# Patient Record
Sex: Female | Born: 1985 | Race: Black or African American | Hispanic: No | Marital: Single | Smoking: Never smoker
Health system: Southern US, Community
[De-identification: ages and names within clinical notes are randomized; demographics above are authoritative.]

## PROBLEM LIST (undated history)

## (undated) DIAGNOSIS — I2699 Other pulmonary embolism without acute cor pulmonale: Secondary | ICD-10-CM

## (undated) DIAGNOSIS — O2230 Deep phlebothrombosis in pregnancy, unspecified trimester: Secondary | ICD-10-CM

## (undated) DIAGNOSIS — I82409 Acute embolism and thrombosis of unspecified deep veins of unspecified lower extremity: Secondary | ICD-10-CM

## (undated) HISTORY — DX: Acute embolism and thrombosis of unspecified deep veins of unspecified lower extremity: I82.409

---

## 2009-09-26 DIAGNOSIS — I2699 Other pulmonary embolism without acute cor pulmonale: Secondary | ICD-10-CM | POA: Insufficient documentation

## 2009-10-03 ENCOUNTER — Inpatient Hospital Stay (HOSPITAL_COMMUNITY): Admission: EM | Admit: 2009-10-03 | Discharge: 2009-10-04 | Payer: Self-pay | Admitting: Emergency Medicine

## 2009-10-03 ENCOUNTER — Ambulatory Visit: Payer: Self-pay | Admitting: Infectious Diseases

## 2009-10-04 ENCOUNTER — Encounter (INDEPENDENT_AMBULATORY_CARE_PROVIDER_SITE_OTHER): Payer: Self-pay | Admitting: Internal Medicine

## 2009-10-07 ENCOUNTER — Ambulatory Visit: Payer: Self-pay | Admitting: Internal Medicine

## 2009-10-07 ENCOUNTER — Encounter (INDEPENDENT_AMBULATORY_CARE_PROVIDER_SITE_OTHER): Payer: Self-pay | Admitting: Internal Medicine

## 2009-10-07 ENCOUNTER — Telehealth (INDEPENDENT_AMBULATORY_CARE_PROVIDER_SITE_OTHER): Payer: Self-pay | Admitting: Internal Medicine

## 2009-10-09 LAB — CONVERTED CEMR LAB
INR: 1.48 (ref <1.50)
Prothrombin Time: 17.8 s — ABNORMAL HIGH (ref 11.6–15.2)

## 2009-10-11 ENCOUNTER — Ambulatory Visit: Payer: Self-pay | Admitting: Internal Medicine

## 2009-10-11 LAB — CONVERTED CEMR LAB: INR: 5.4

## 2009-10-18 ENCOUNTER — Ambulatory Visit: Payer: Self-pay | Admitting: Internal Medicine

## 2009-10-18 LAB — CONVERTED CEMR LAB: INR: 7.1

## 2009-10-20 ENCOUNTER — Ambulatory Visit: Payer: Self-pay | Admitting: Internal Medicine

## 2009-10-20 ENCOUNTER — Telehealth: Payer: Self-pay | Admitting: *Deleted

## 2009-10-20 LAB — CONVERTED CEMR LAB
Basophils Absolute: 0 10*3/uL (ref 0.0–0.1)
Basophils Relative: 0 % (ref 0–1)
Bilirubin Urine: NEGATIVE
Chlamydia, Swab/Urine, PCR: NEGATIVE
Eosinophils Absolute: 0.1 10*3/uL (ref 0.0–0.7)
Eosinophils Relative: 2 % (ref 0–5)
GC Probe Amp, Urine: NEGATIVE
HCT: 37.8 % (ref 36.0–46.0)
Hemoglobin, Urine: NEGATIVE
Hemoglobin: 13 g/dL (ref 12.0–15.0)
INR: 3.1
Ketones, ur: NEGATIVE mg/dL
Leukocytes, UA: NEGATIVE
Lymphocytes Relative: 55 % — ABNORMAL HIGH (ref 12–46)
Lymphs Abs: 3.1 10*3/uL (ref 0.7–4.0)
MCHC: 34.4 g/dL (ref 30.0–36.0)
MCV: 82 fL (ref >=78.0)
Monocytes Absolute: 0.5 10*3/uL (ref 0.1–1.0)
Monocytes Relative: 8 % (ref 3–12)
Neutro Abs: 1.9 10*3/uL (ref 1.7–7.7)
Neutrophils Relative %: 35 % — ABNORMAL LOW (ref 43–77)
Nitrite: NEGATIVE
Platelets: 378 10*3/uL (ref 150–400)
Protein, ur: NEGATIVE mg/dL
RBC: 4.61 M/uL (ref 3.87–5.11)
RDW: 12.3 % (ref 11.5–15.5)
Specific Gravity, Urine: 1.017 (ref 1.005–1.0)
Urine Glucose: NEGATIVE mg/dL
Urobilinogen, UA: 0.2 (ref 0.0–1.0)
WBC: 5.6 10*3/uL (ref 4.0–10.5)
pH: 7.5 (ref 5.0–8.0)

## 2009-10-21 ENCOUNTER — Encounter (INDEPENDENT_AMBULATORY_CARE_PROVIDER_SITE_OTHER): Payer: Self-pay | Admitting: Dermatology

## 2009-10-26 ENCOUNTER — Ambulatory Visit: Payer: Self-pay | Admitting: Internal Medicine

## 2009-10-26 LAB — CONVERTED CEMR LAB: INR: 1.8

## 2009-11-01 ENCOUNTER — Ambulatory Visit: Payer: Self-pay | Admitting: Internal Medicine

## 2009-11-01 LAB — CONVERTED CEMR LAB: INR: 2.2

## 2009-11-11 ENCOUNTER — Ambulatory Visit: Payer: Self-pay | Admitting: Internal Medicine

## 2009-11-12 LAB — CONVERTED CEMR LAB
Bilirubin Urine: NEGATIVE
Hemoglobin, Urine: NEGATIVE
Ketones, ur: NEGATIVE mg/dL
Leukocytes, UA: NEGATIVE
Nitrite: NEGATIVE
Protein, ur: NEGATIVE mg/dL
Specific Gravity, Urine: 1.017 (ref 1.005–1.0)
Urine Glucose: NEGATIVE mg/dL
Urobilinogen, UA: 0.2 (ref 0.0–1.0)
pH: 5.5 (ref 5.0–8.0)

## 2009-11-15 ENCOUNTER — Ambulatory Visit: Payer: Self-pay | Admitting: Internal Medicine

## 2009-11-15 LAB — CONVERTED CEMR LAB: INR: 5

## 2009-11-29 ENCOUNTER — Ambulatory Visit: Payer: Self-pay | Admitting: Internal Medicine

## 2009-11-29 LAB — CONVERTED CEMR LAB: INR: 2.8

## 2009-12-14 ENCOUNTER — Telehealth: Payer: Self-pay | Admitting: Internal Medicine

## 2009-12-27 ENCOUNTER — Ambulatory Visit: Payer: Self-pay | Admitting: Internal Medicine

## 2009-12-27 ENCOUNTER — Encounter (INDEPENDENT_AMBULATORY_CARE_PROVIDER_SITE_OTHER): Payer: Self-pay | Admitting: Pharmacist

## 2009-12-27 LAB — CONVERTED CEMR LAB
INR: 2.7
INR: 2.18 — ABNORMAL HIGH (ref <1.50)
Prothrombin Time: 24.1 s — ABNORMAL HIGH (ref 11.6–15.2)

## 2010-01-24 ENCOUNTER — Ambulatory Visit: Payer: Self-pay | Admitting: Internal Medicine

## 2010-01-24 LAB — CONVERTED CEMR LAB: INR: 2.5

## 2010-02-22 ENCOUNTER — Emergency Department (HOSPITAL_COMMUNITY): Admission: EM | Admit: 2010-02-22 | Discharge: 2010-02-23 | Payer: Self-pay | Admitting: Emergency Medicine

## 2010-02-23 ENCOUNTER — Encounter: Payer: Self-pay | Admitting: Internal Medicine

## 2010-02-23 ENCOUNTER — Ambulatory Visit: Payer: Self-pay | Admitting: Surgery

## 2010-02-23 ENCOUNTER — Ambulatory Visit: Payer: Self-pay | Admitting: Infectious Diseases

## 2010-02-23 ENCOUNTER — Telehealth: Payer: Self-pay | Admitting: Internal Medicine

## 2010-02-23 ENCOUNTER — Observation Stay (HOSPITAL_COMMUNITY): Admission: AD | Admit: 2010-02-23 | Discharge: 2010-02-24 | Payer: Self-pay | Admitting: Infectious Diseases

## 2010-02-23 ENCOUNTER — Encounter: Payer: Self-pay | Admitting: Ophthalmology

## 2010-02-23 ENCOUNTER — Ambulatory Visit: Payer: Self-pay | Admitting: Internal Medicine

## 2010-02-24 ENCOUNTER — Encounter: Payer: Self-pay | Admitting: Ophthalmology

## 2010-02-25 ENCOUNTER — Ambulatory Visit: Payer: Self-pay | Admitting: Internal Medicine

## 2010-02-25 ENCOUNTER — Ambulatory Visit (HOSPITAL_COMMUNITY): Admission: RE | Admit: 2010-02-25 | Discharge: 2010-02-25 | Payer: Self-pay | Admitting: Obstetrics & Gynecology

## 2010-02-25 LAB — CONVERTED CEMR LAB

## 2010-02-28 ENCOUNTER — Ambulatory Visit (HOSPITAL_COMMUNITY)
Admission: RE | Admit: 2010-02-28 | Discharge: 2010-02-28 | Payer: Self-pay | Source: Home / Self Care | Admitting: Obstetrics & Gynecology

## 2010-03-01 ENCOUNTER — Inpatient Hospital Stay (HOSPITAL_COMMUNITY): Admission: AD | Admit: 2010-03-01 | Discharge: 2010-03-01 | Payer: Self-pay | Admitting: Obstetrics & Gynecology

## 2010-03-08 ENCOUNTER — Ambulatory Visit: Payer: Self-pay | Admitting: Internal Medicine

## 2010-03-28 ENCOUNTER — Ambulatory Visit: Payer: Self-pay | Admitting: Internal Medicine

## 2010-03-28 LAB — CONVERTED CEMR LAB: INR: 1.5

## 2010-04-08 ENCOUNTER — Telehealth: Payer: Self-pay | Admitting: *Deleted

## 2010-04-25 ENCOUNTER — Ambulatory Visit: Payer: Self-pay | Admitting: Internal Medicine

## 2010-04-25 LAB — CONVERTED CEMR LAB: INR: 2.8

## 2010-05-23 ENCOUNTER — Ambulatory Visit: Payer: Self-pay

## 2010-05-30 ENCOUNTER — Ambulatory Visit: Payer: Self-pay | Admitting: Internal Medicine

## 2010-05-30 LAB — CONVERTED CEMR LAB: INR: 4.2

## 2010-07-02 ENCOUNTER — Encounter: Payer: Self-pay | Admitting: Obstetrics & Gynecology

## 2010-07-03 DIAGNOSIS — Z7901 Long term (current) use of anticoagulants: Secondary | ICD-10-CM | POA: Insufficient documentation

## 2010-07-03 DIAGNOSIS — I2699 Other pulmonary embolism without acute cor pulmonale: Secondary | ICD-10-CM

## 2010-07-11 ENCOUNTER — Ambulatory Visit
Admission: RE | Admit: 2010-07-11 | Discharge: 2010-07-11 | Payer: Self-pay | Source: Home / Self Care | Attending: Internal Medicine | Admitting: Internal Medicine

## 2010-07-11 LAB — CONVERTED CEMR LAB: INR: 1.4

## 2010-07-12 NOTE — Progress Notes (Signed)
Summary: INR Result//kg  Phone Note Outgoing Call   Call placed by: Cynda Familia Duncan Dull),  October 07, 2009 4:34 PM Call placed to: Patient Summary of Call: Pt's INR was 1.48.  Call made to pt to inform her to continue her current Lovenox dose and increase her coumadin from 5mg  daily to 7.5mg  daily and f/u with Dr Alexandria Lodge in the coumadin clinic on Monday May 2nd as already scheduled.  Pt also stated that her menses is heavier than usual since she's no longer taking birthcontrol  and wanted to know if that was ok.  Dicussused this with Dr Aundria Rud,  as pt denies any dizziness, shortness of breath, or chest pain she was instructed to use her own good judgement as to whether the bleeding was too excessive. She was also instructed to return to ER if any of the synptoms presented.  Pt will wait until tomorrow to see if she feels like she needs to be seen for her heavy menses. Initial call taken by: Cynda Familia Duncan Dull),  October 07, 2009 4:46 PM  Follow-up for Phone Call        Thanks a lot! Follow-up by: Silvestre Gunner MD,  October 09, 2009 1:03 PM

## 2010-07-12 NOTE — Assessment & Plan Note (Signed)
Summary: COU/CH  Anticoagulant Therapy Managed by: Barbera Setters. Janie Morning  PharmD CACP [facility] Attending: Coralee Pesa MD, Levada Schilling Indication 1: Pulmonary  embolus Indication 2: Encounter for therapeutic drug monitoring  V58.83 Start date: 10/03/2009 Duration: 1 year  Patient Assessment Reviewed by: Chancy Milroy PharmD  Oct 18, 2009 Medication review: verified warfarin dosage & schedule,verified previous prescription medications, verified doses & any changes, verified new medications, reviewed OTC medications, reviewed OTC health products-vitamins supplements etc Complications: none Dietary changes: none   Health status changes: none   Lifestyle changes: none   Recent/future hospitalizations: none   Recent/future procedures: none   Recent/future dental: none Patient Assessment Part 2:  Have you MISSED ANY DOSES or CHANGED TABLETS?  No missed Warfarin doses or changed tablets.  Have you had any BRUISING or BLEEDING ( nose or gum bleeds,blood in urine or stool)?  No reported bruising or bleeding in nose, gums, urine, stool.  Have you STARTED or STOPPED any MEDICATIONS, including OTC meds,herbals or supplements?  No other medications or herbal supplements were started or stopped.  Have you CHANGED your DIET, especially green vegetables,or ALCOHOL intake?  No changes in diet or alcohol intake.  Have you had any ILLNESSES or HOSPITALIZATIONS?  No reported illnesses or hospitalizations  Have you had any signs of CLOTTING?(chest discomfort,dizziness,shortness of breath,arms tingling,slurred speech,swelling or redness in leg)    No chest discomfort, dizziness, shortness of breath, tingling in arm, slurred speech, swelling, or redness in leg.     Treatment  Target INR: 2.0-3.0 INR: 7.1  Date: 10/18/2009 Regimen In:  35.0mg /week INR reflects regimen in: 7.1  New  Tablet strength: : 5mg  Regimen Out:     Sunday: 0 Tablet     Monday: 0 Tablet     Tuesday: 0 Tablet     Wednesday: 0 Tablet     Thursday: 0 Tablet      Friday: 0 Tablet     Saturday: 0 Tablet Total Weekly: 0mg /wk mg  Next INR Due: 10/20/2009 Adjusted by: Barbera Setters. Alexandria Lodge III PharmD CACP   Return to anticoagulation clinic:  10/20/2009 Time of next visit: 1000  Hold:  2 Days

## 2010-07-12 NOTE — Assessment & Plan Note (Signed)
Summary: COU/CH  Anticoagulant Therapy Managed by: Barbera Setters. Alexandria Lodge III  PharmD CACP Indication 1: Pulmonary  embolus Indication 2: Encounter for therapeutic drug monitoring  V58.83 Start date: 10/03/2009 Duration: 1 year  Patient Assessment Reviewed by: Chancy Milroy PharmD  Nov 04, 2009 Medication review: verified warfarin dosage & schedule,verified previous prescription medications, verified doses & any changes, verified new medications, reviewed OTC medications, reviewed OTC health products-vitamins supplements etc Complications: none Dietary changes: none   Health status changes: none   Lifestyle changes: none   Recent/future hospitalizations: none   Recent/future procedures: none   Recent/future dental: none Patient Assessment Part 2:  Have you MISSED ANY DOSES or CHANGED TABLETS?  No missed Warfarin doses or changed tablets.  Have you had any BRUISING or BLEEDING ( nose or gum bleeds,blood in urine or stool)?  No reported bruising or bleeding in nose, gums, urine, stool.  Have you STARTED or STOPPED any MEDICATIONS, including OTC meds,herbals or supplements?  No other medications or herbal supplements were started or stopped.  Have you CHANGED your DIET, especially green vegetables,or ALCOHOL intake?  No changes in diet or alcohol intake.  Have you had any ILLNESSES or HOSPITALIZATIONS?  No reported illnesses or hospitalizations  Have you had any signs of CLOTTING?(chest discomfort,dizziness,shortness of breath,arms tingling,slurred speech,swelling or redness in leg)    No chest discomfort, dizziness, shortness of breath, tingling in arm, slurred speech, swelling, or redness in leg.     Treatment  Target INR: 2.0-3.0 INR: 2.2  Date: 11/01/2009 Regimen In:  27.5mg /6d INR reflects regimen in: 2.2  New  Tablet strength: : 5mg  Regimen Out:     Sunday: 1 Tablet     Monday: 1 Tablet     Tuesday: 1 Tablet     Wednesday: 1 & 1/2 Tablet     Thursday: 1 Tablet  Friday: 1 Tablet     Saturday: 1 Tablet Total Weekly: 37.5mg /week mg  Next INR Due: 11/15/2009 Adjusted by: Barbera Setters. Alexandria Lodge III PharmD CACP   Return to anticoagulation clinic:  11/15/2009 Time of next visit: 1000

## 2010-07-12 NOTE — Assessment & Plan Note (Signed)
Summary: HFU-PCP-HO/CFB PER [facility]/CFB   Vital Signs:  Patient profile:   25 year old female Height:      63 inches (160.02 cm) Weight:      164 pounds (74.55 kg) BMI:     29.16 Temp:     98.2 degrees F (36.78 degrees C) oral Pulse rate:   78 / minute BP sitting:   113 / 77  (left arm)  Vitals Entered By: Angelina Ok RN (March 08, 2010 3:33 PM) CC: follow up Is Patient Diabetic? No Pain Assessment Patient in pain? no      Nutritional Status BMI of 25 - 29 = overweight  Have you ever been in a relationship where you felt threatened, hurt or afraid?No   Does patient need assistance? Functional Status Self care Ambulation Normal Comments Follow up. Had a miscarriage last Tuesday.   Primary Care Provider:  Rosana Berger MD  CC:  follow up.  History of Present Illness: 25 y/o woman with h/o PW due to birth controll pills and h/o getting pregnant while being on coumadin for the intial pulmonary emboli comes for follow up. She was recently in the hospital for evalaution of chest pain which was ruled out as pulmonary emboli. she mis-carried her baby and his currently not pregnant. she has been restarted on coumadin by Dr. Alexandria Lodge and seems to be doing fine. no CP, SOb or other compaints. she inquires about birth control options.    Depression History:      The patient denies a depressed mood most of the day and a diminished interest in her usual daily activities.         Preventive Screening-Counseling & Management  Alcohol-Tobacco     Alcohol drinks/day: Ocasionally     Alcohol type: beer     Smoking Status: never  Current Medications (verified): 1)  Coumadin 7.5 Mg Tabs (Warfarin Sodium)  Allergies (verified): No Known Drug Allergies  Review of Systems  The patient denies anorexia, fever, weight loss, weight gain, vision loss, decreased hearing, hoarseness, chest pain, syncope, dyspnea on exertion, peripheral edema, prolonged cough, headaches, hemoptysis,  abdominal pain, melena, hematochezia, severe indigestion/heartburn, hematuria, incontinence, genital sores, muscle weakness, suspicious skin lesions, transient blindness, difficulty walking, depression, unusual weight change, abnormal bleeding, enlarged lymph nodes, angioedema, breast masses, and testicular masses.    Physical Exam  General:  alert, well-developed, well-nourished, and well-hydrated.   Head:  normocephalic and atraumatic.   Eyes:  vision grossly intact, pupils equal, pupils round, and pupils reactive to light.   Ears:  R ear normal and L ear normal.   Nose:  no external deformity.   Neck:  supple and full ROM.   Chest Wall:  no deformities.   Lungs:  normal respiratory effort, no intercostal retractions, no accessory muscle use, and normal breath sounds.   Heart:  normal rate, regular rhythm, no murmur, and no gallop.   Abdomen:  soft, non-tender, normal bowel sounds, no distention, no masses, and no guarding.   Msk:  normal ROM, no joint tenderness, no joint swelling, and no joint warmth.   Pulses:  +2 radial and DP/PT pulses bilaterally Extremities:  No edema, no calf tenderness Neurologic:  alert & oriented X3.     Impression & Recommendations:  Problem # 1:  ABDOMINAL PREGNANCY WITH INTRAUTERINE PREGNANCY (ICD-633.01) she has miscarried her pregnancy. she is currently not pregnant and is back on coumadin  Problem # 2:  LONG-TERM USE OF ANTIPLATELET/ANTITHROMBOTIC (ICD-V58.63) coumadin per Dr. Alexandria Lodge. continue  with treatment.   Problem # 3:  PE (ICD-415.19) no current complain ofCP, Sob etc. inquired about birth control. Have  asked to follow up with health department for options for birth control She can possibly get a implinon or other progesterone only contraceptive. She does not have health insurance so it would be good for her to try health department before OB/GYN. Advised to use spermicidal along with condoms until then.   Her updated medication list for this  problem includes:    Coumadin 7.5 Mg Tabs (Warfarin sodium)  Complete Medication List: 1)  Coumadin 7.5 Mg Tabs (Warfarin sodium)  Patient Instructions: 1)  You can visit the health department for counselling on birth control options  2)  Use a spermicidal cream in addition to condom until then 3)  Continue seeing Dr. Alexandria Lodge per regualr schedule 4)  I will see you again if you have any concerns or questions.  ; Prevention & Chronic Care Immunizations   Influenza vaccine: Not documented   Influenza vaccine deferral: Refused  (03/08/2010)    Tetanus booster: Not documented   Td booster deferral: Refused  (03/08/2010)    Pneumococcal vaccine: Not documented  Other Screening   Pap smear: Not documented   Pap smear action/deferral: Deferred  (03/08/2010)   Smoking status: never  (03/08/2010)    Vital Signs:  Patient profile:   25 year old female Height:      63 inches (160.02 cm) Weight:      164 pounds (74.55 kg) BMI:     29.16 Temp:     98.2 degrees F (36.78 degrees C) oral Pulse rate:   78 / minute BP sitting:   113 / 77  (left arm)  Vitals Entered By: Angelina Ok RN (March 08, 2010 3:33 PM)

## 2010-07-12 NOTE — Progress Notes (Signed)
  Phone Note Other Incoming   Caller: [facility] ER Summary of Call: Received call from ER Claris Che McDOnald, PA) on 02/23/10 at 1:00am.  Pt is now pregnant  (HCG 1086), on coumadin for treatment of PE.  Pt is aware that she will likely have to terminate her pregnancy b/c coumadin is a category x medication.   Pt was provided with significant education re: PE, coumadin therapy, etc while in the ER.  She will need referral to [facility] for follow up care with OB. ER will provide pt with contact information for [facility], however this needs to be followed up in clinic to ensure pt establishes an appt with OB.  She should be contacted on 9/14/11with an appt date/time in [facility] for referral. Initial call taken by: Nelda Bucks DO,  February 23, 2010 1:06 AM

## 2010-07-12 NOTE — Discharge Summary (Signed)
Summary: Hospital Discharge Update    Hospital Discharge Update:  Date of Admission: 02/23/2010 Date of Discharge: 02/24/2010  Brief Summary:  This is a 25 yo with hx or PE back in 4//11(now on coumadin) who presented to the [facility] clinic on 9/14 because of a 1 week hx of chest pain and a 1 month hx of SOB.  These symptoms were similar to those experienced during her last PE.  Of note, the patient went to the ED on the day prior to admissoin and was found to be pregnant.  During her hospitalization, the patient's ddimer was found to be negative and her CTA (with sheilding) was negative.  The patient was transitioned over to lovenox  Labs needed at follow-up: CBC with differential, Basic metabolic panel, PT/INR  Other labs needed at follow-up: Anti-Xa level-measured 4 hour post lovenox dose.  Other follow-up issues:  Pt will follow up with OBGYN as she is considering the risks/benefits of abortion.  Medication list changes:  Added new medication of LOVENOX 80 MG/0.8ML SOLN (ENOXAPARIN SODIUM) Inject 75 MG Subcutaneously every 12 hours. - Signed Rx of LOVENOX 80 MG/0.8ML SOLN (ENOXAPARIN SODIUM) Inject 75 MG Subcutaneously every 12 hours.;  #60 x 0;  Signed;  Entered by: Sinda Du MD;  Authorized by: Sinda Du MD;  Method used: Print then Give to Patient  The medication, problem, and allergy lists have been updated.  Please see the dictated discharge summary for details.  Discharge medications:  LOVENOX 80 MG/0.8ML SOLN (ENOXAPARIN SODIUM) Inject 75 MG Subcutaneously every 12 hours.  Other patient instructions:  Please come for an appointment at the outpatient clinic at [facility] on 9/27 at 3 :00 pm with Dr. Scot Dock for a followup visit.  Please come for appointment with your OBGYN at the [facility] OB clinic on Sept 27th, 2011 at 8:45 am.   Please take your medication as prescribed below.  DO NOT TAKE YOUR COUMADIN.  If you notice blood in your stool or black  stool please call the clinic.   If you have any problem, Please call the clinic.   In case of an emergency  dial 911 or go to the emergency department.    Note: Hospital Discharge Medications & Other Instructions handout was printed, one copy for patient and a second copy to be placed in hospital chart.

## 2010-07-12 NOTE — Assessment & Plan Note (Signed)
Summary: COU/CH  Anticoagulant Therapy Managed by: Barbera Setters. Janie Morning  PharmD CACP PCP: Rosana Berger MD [facility] Attending: Rogelia Boga MD, Lanora Manis Indication 1: Pulmonary  embolus Indication 2: Encounter for therapeutic drug monitoring  V58.83 Start date: 10/03/2009 Duration: 1 year  Patient Assessment Reviewed by: Chancy Milroy PharmD  March 28, 2010 Medication review: verified warfarin dosage & schedule,verified previous prescription medications, verified doses & any changes, verified new medications, reviewed OTC medications, reviewed OTC health products-vitamins supplements etc Complications: none Dietary changes: none   Health status changes: none   Lifestyle changes: none   Recent/future hospitalizations: none   Recent/future procedures: none   Recent/future dental: none Patient Assessment Part 2:  Have you MISSED ANY DOSES or CHANGED TABLETS?  No missed Warfarin doses or changed tablets.  Have you had any BRUISING or BLEEDING ( nose or gum bleeds,blood in urine or stool)?  No reported bruising or bleeding in nose, gums, urine, stool.  Have you STARTED or STOPPED any MEDICATIONS, including OTC meds,herbals or supplements?  No other medications or herbal supplements were started or stopped.  Have you CHANGED your DIET, especially green vegetables,or ALCOHOL intake?  No changes in diet or alcohol intake.  Have you had any ILLNESSES or HOSPITALIZATIONS?  No reported illnesses or hospitalizations  Have you had any signs of CLOTTING?(chest discomfort,dizziness,shortness of breath,arms tingling,slurred speech,swelling or redness in leg)    No chest discomfort, dizziness, shortness of breath, tingling in arm, slurred speech, swelling, or redness in leg.     Treatment  Target INR: 2.0-3.0 INR: 1.5  Date: 03/28/2010 Regimen In:  35.0mg /week INR reflects regimen in: 1.5  New  Tablet strength: : 5mg  Regimen Out:     Sunday: 1 Tablet     Monday: 1 & 1/2 Tablet     Tuesday: 1  Tablet     Wednesday: 1 & 1/2 Tablet     Thursday: 1 Tablet      Friday: 1 & 1/2 Tablet     Saturday: 1 Tablet Total Weekly: 42.5mg /week mg  Next INR Due: 04/25/2010 Adjusted by: Barbera Setters. Alexandria Lodge III PharmD CACP   Return to anticoagulation clinic:  04/25/2010 Time of next visit: 1400    Allergies: No Known Drug Allergies

## 2010-07-12 NOTE — Progress Notes (Signed)
Summary: phone/gg  Phone Note Call from Patient   Caller: Patient Summary of Call: Pt called with c/o blood tinged urine and white d/c noted when she voids.  Low abd pain with voiding.  Onset several hours ago. denie fever , frequency Pt # [id number]  will see today Initial call taken by: Merrie Roof RN,  Oct 20, 2009 2:50 PM

## 2010-07-12 NOTE — Assessment & Plan Note (Signed)
Summary: pregnant on coumadin/pcp-ho/hla   Vital Signs:  Patient profile:   25 year old female Height:      63 inches (160.02 cm) Weight:      164.5 pounds (73.23 kg) BMI:     28.64 Temp:     99.1 degrees F (37.28 degrees C) oral Pulse rate:   85 / minute BP sitting:   112 / 76  (left arm) Cuff size:   regular  Vitals Entered By: Theotis Barrio NT II (February 23, 2010 1:45 PM) CC: PATIENT ADDED ON  FOUND OUT SHE IS PREG. LAST NIGHT / PATIENT IS ON COUMADIN / SEEN IN THE ED FOR THE CHEST PAIN Is Patient Diabetic? No Pain Assessment Patient in pain? yes     Location: chest Intensity: 4 Type: sharp Onset of pain  ABOUT A WEEK AGO / SEEN IN THE ED / PAIN COME AND GOES Nutritional Status BMI of 25 - 29 = overweight  Have you ever been in a relationship where you felt threatened, hurt or afraid?No   Does patient need assistance? Functional Status Self care Ambulation Normal   CC:  PATIENT ADDED ON  FOUND OUT SHE IS PREG. LAST NIGHT / PATIENT IS ON COUMADIN / SEEN IN THE ED FOR THE CHEST PAIN.  History of Present Illness: This is a 25 year old female with a history of PE in 10/03/09, now on coumadin who presents because of pregnancy which was diagnosed in the ED yesterday.  Pt and her partner have only been using infrequent contraception with condoms. Pt is now considering abortion if the infant has a risk of birth defects.  The patient has been taking her coumadin regularly as prescribed and her INR was 2.17 in the ED yesterday. The patient denies any easy bleeding.  Yesterday, the patient went to the ER because of chest pain  which she describes as sharp, substernal and without radiation. The patient has been experiencing this pain which comes and goes for approximately one week.  The patient has also experienced SOB for the last 3 months which is not associated with the chest pain.  The patient feels that the pain she is currently experiencing is similar to that experienced during  her last PE.     Preventive Screening-Counseling & Management  Alcohol-Tobacco     Alcohol drinks/day: Ocasionally     Alcohol type: beer     Smoking Status: never  Caffeine-Diet-Exercise     Does Patient Exercise: no  Problems Prior to Update: 1)  Pelvic Pain  (ICD-789.09) 2)  Long-term Use of Antiplatelet/antithrombotic  (ICD-V58.63) 3)  Pe  (ICD-415.19)  Medications Prior to Update: 1)  Coumadin 5 Mg Tabs (Warfarin Sodium) .... Take 1 Tablet Daily  Allergies: No Known Drug Allergies  Past History:  Past Medical History: Reviewed history from 11/11/2009 and no changes required. bilateral segmental PE  Family History: Reviewed history from 11/11/2009 and no changes required. No hx of thromboembolism  Social History: Reviewed history from 11/11/2009 and no changes required. Occupation:  unemployed Single Never Smoked Alcohol use-no Drug use-no  Review of Systems       NO change bm, no dysuria, does have easy bruising, headaches on occasion.   Physical Exam  General:  alert and well-developed.   Head:  normocephalic and atraumatic.   Eyes:  vision grossly intact, pupils equal, pupils round, and pupils reactive to light.   Ears:  R ear normal and L ear normal.   Nose:  no external deformity  and no nasal discharge.   Mouth:  good dentition.   Neck:  supple and full ROM.   Chest Wall:  no tenderness.   Lungs:  normal respiratory effort, normal breath sounds, no dullness, no crackles, and no wheezes.   Heart:  normal rate, regular rhythm, no murmur, no gallop, and no rub.   Abdomen:  soft, non-tender, normal bowel sounds, no distention, no masses, no guarding, no rigidity, and no rebound tenderness.   Msk:  normal ROM, no joint tenderness, and no joint swelling.   Pulses:  +2 radial and DP/PT pulses bilaterally Extremities:  No edema Neurologic:  alert & oriented X3.   Skin:  No rashes Cervical Nodes:  No lymphadanopathy   Impression &  Recommendations:  Problem # 1:  CHEST PAIN (ICD-786.50) Given high risk of PE in this patient it is felt that she should be hospitalized for observation over night.  A ddimer has been drawn to try to RO PE, and doplers of the LE will be performed in house.  CT angio must be avoided at this point given 1st trimester pregnancy.  Orders: Obstetric Referral (Obstetric)  Problem # 2:  LONG-TERM USE OF ANTIPLATELET/ANTITHROMBOTIC (ICD-V58.63) The patient's coumadin has been stopped since yesterday. Pt has been on coumadin for 4 months, therefore, if she was ruled out for PE, we could consider discontinuing her anticoagulation given that this has been her first unprovoked PE.  If PE can not be ruled out, pt should be started on Lovenox and continue through out her first trimester.  Will check PT/INR now to determine if pt continues to be therapeutic as we will begin lovenox now if she is not.   Orders: T-Protime [facility]) ([id number]) T-PTT [facility]) ([id number])  Problem # 3:  ABDOMINAL PREGNANCY WITH INTRAUTERINE PREGNANCY (ICD-633.01)  Orders: Obstetric Referral (Obstetric)  HCG was aprox 1000 on serum pregnacy test from ER which represents a very early pregnacy.  Given concerns over possible birth defects, patient inquired regarding the risks and benefits of abortion.  I will defer this conversation to her obstetrician who is more qualified to provied advice in this area.    Other Orders: T-CBC [facility]) ([id number]) T-Basic Metabolic Profile [facility]) [phone]) T-D-Dimer [facility]) [phone])  Patient Instructions: 1)  You will be hospitalized on an observation basis for your chest pain.  A followup clinic appointment will be made for you at the time of discharge.    Prevention & Chronic Care Immunizations   Influenza vaccine: Not documented    Tetanus booster: Not documented    Pneumococcal vaccine: Not documented  Other Screening   Pap smear: Not documented   Smoking  status: never  (02/23/2010)    Medication Administration  Infusion # 1:    Diagnosis: ABDOMINAL PREGNANCY WITH INTRAUTERINE PREGNANCY (ICD-633.01)    Started: 1545    Solution: SALINE LOCK    Route: NS SALINE    Site: L hand    Ordered by: JOINES    Administered by: Foye Deer RNFNE    Comments: #22 SALINE LOCK, 1 STICK, TOP l HAND, STERILE DRSG APPLIED, FLUSHES WELL W/ SALINE FLUSH    Patient tolerated infusion without complications  Orders Added: 1)  T-CBC [facility]) [[id number]] 2)  T-Basic Metabolic Profile [facility]) [[id number]] 3)  T-D-Dimer [facility]) [[id number]] 4)  T-Protime [facility]) [[id number]] 5)  T-PTT [facility]) [85732-PTT] 6)  Obstetric Referral [Obstetric] 7)  Est. Patient Level III [60109] Report caled to Nurse on 3700.  Pt has Saline lock in left hand.  Transported to 3728.1 via wheelchair.  Pt denies any discomfort at transfer.Angelina Ok, RN  February 23, 2010 3:59 PM

## 2010-07-12 NOTE — Assessment & Plan Note (Signed)
Summary: uti/gg   Vital Signs:  Patient profile:   25 year old female Height:      63 inches Weight:      160.8 pounds BMI:     28.59 Temp:     99.0 degrees F oral Pulse rate:   79 / minute BP sitting:   114 / 75  (right arm)  Vitals Entered By: Filomena Jungling NT II (Oct 20, 2009 3:28 PM) CC: ?uti Is Patient Diabetic? No Pain Assessment Patient in pain? no      Nutritional Status BMI of 25 - 29 = overweight  Have you ever been in a relationship where you felt threatened, hurt or afraid?No   Does patient need assistance? Functional Status Self care Ambulation Normal   CC:  ?uti.  History of Present Illness: [name] is a very pleasant young woman who comes in for new onset hematuria following the initiation of coumadin for DVT/PE earlier this month. She has been keeping regular appointments for INR checks. Slightly supratherapuetic today and at prior visit. No c/o small amount of blood in her urine, associated with mild pain  Physical Exam  General:  alert, well-developed, and well-nourished.   Mouth:  good dentition.   Neck:  supple and full ROM.   Lungs:  normal respiratory effort, no intercostal retractions, no accessory muscle use, and normal breath sounds.   Heart:  normal rate, regular rhythm, and no murmur.   Abdomen:  soft and non-tender.  No CVA tenderness. Msk:  normal ROM, no joint tenderness, and no joint swelling.   Pulses:  no edema Extremities:  no leg tenderness Neurologic:  alert & oriented X3, cranial nerves II-XII intact, and strength normal in all extremities.   Skin:  turgor normal, color normal, and no rashes.   Psych:  Oriented X3, memory intact for recent and remote, and normally interactive.     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 Relatively asymptomatic now. Will review hypercoagulability panel.primary source was OCP's which have been stopped. Expect 6 mo tx. F/U in 2 weeks.  Has HFU scheduled. The following medications were removed from the medication list:    Lovenox 120 Mg/0.50ml Soln (Enoxaparin sodium) ..... Inject 110 units subcutaneously daily x 5 days Her updated medication list for this problem includes:    Coumadin 5 Mg Tabs (Warfarin sodium) .Marland Kitchen... Take 1 tablet daily  Problem # 2:  PELVIC  PAIN (ICD-789.09) Mild pain, mostly with urination.  Will check urine probe for GC and C. Also will check UPG. I counselled her extensively today on using some form of contraception. No hormones. Should use condoms. If she has a condom break she should come to teh clinic immediately for Plan B- would be on while on coumadin.  The following medications were removed from the medication list:    Vicodin 5-500 Mg Tabs (Hydrocodone-acetaminophen) .Marland Kitchen... Take 1 tablet every six hours as needed for pain  Orders: T-Urine Pregnancy (in -house) [phone]) T-Chlamydia & GC Probe, Urine ([id number])  Problem # 3:  GROSS HEMATURIA (ICD-599.71) Will check UA. Possible UTI vs. benign hematuria with supratherapuetic INR. Will check CBC.  Await results and follow-up as needed.  Orders: T-Urinalysis ([id number]) T-Culture, Urine ([id number]) T-CBC w/Diff ([id number])  Complete Medication List: 1)  Coumadin 5 Mg Tabs (Warfarin sodium) .... Take 1 tablet daily  Patient Instructions: 1)  F/U in 2 weeks. 2)  Will call you with results of your urine tests  and bloodwork.  Prevention & Chronic Care Immunizations   Influenza vaccine: Not documented    Tetanus booster: Not documented    Pneumococcal vaccine: Not documented  Other Screening   Pap smear: Not documented   Smoking status: Not documented  Appended Document: Lab Order/Upreg results    Lab Visit  Laboratory Results   Urine Tests  Date/Time Received: Oct 20, 2009 4:43 PM Date/Time Reported: Alric Quan  Oct 20, 2009 4:44 PM     Urine HCG: negative Comments: Urine specific gravity  1.016   Alric Quan  Oct 20, 2009 4:44 PM    Orders Today:

## 2010-07-12 NOTE — Assessment & Plan Note (Signed)
Summary: COU/CH  Anticoagulant Therapy Managed by: Barbera Setters. Alexandria Lodge III  PharmD CACP O[facility] Attending: Darl Pikes, Beth Indication 1: Pulmonary  embolus Indication 2: Encounter for therapeutic drug monitoring  V58.83 Start date: 10/03/2009 Duration: 1 year  Patient Assessment Reviewed by: Chancy Milroy PharmD  Oct 26, 2009 Medication review: verified warfarin dosage & schedule,verified previous prescription medications, verified doses & any changes, verified new medications, reviewed OTC medications, reviewed OTC health products-vitamins supplements etc Complications: none Dietary changes: none   Health status changes: none   Lifestyle changes: none   Recent/future hospitalizations: none   Recent/future procedures: none   Recent/future dental: none Patient Assessment Part 2:  Have you MISSED ANY DOSES or CHANGED TABLETS?  No missed Warfarin doses or changed tablets.  Have you had any BRUISING or BLEEDING ( nose or gum bleeds,blood in urine or stool)?  No reported bruising or bleeding in nose, gums, urine, stool.  Have you STARTED or STOPPED any MEDICATIONS, including OTC meds,herbals or supplements?  No other medications or herbal supplements were started or stopped.  Have you CHANGED your DIET, especially green vegetables,or ALCOHOL intake?  No changes in diet or alcohol intake.  Have you had any ILLNESSES or HOSPITALIZATIONS?  No reported illnesses or hospitalizations  Have you had any signs of CLOTTING?(chest discomfort,dizziness,shortness of breath,arms tingling,slurred speech,swelling or redness in leg)    No chest discomfort, dizziness, shortness of breath, tingling in arm, slurred speech, swelling, or redness in leg.     Treatment  Target INR: 2.0-3.0 INR: 1.8  Date: 10/26/2009 Regimen In:  17.5mg /5 days INR reflects regimen in: 1.8  New  Tablet strength: : 5mg  Regimen Out:     Sunday: 1 Tablet     Monday: 1 Tablet     Tuesday: 1 Tablet     Wednesday: 1 Tablet    Thursday: 1 Tablet      Friday: 1 Tablet     Saturday: 1/2 Tablet Total Weekly: 27.5mg /6d mg  Next INR Due: 11/01/2009 Adjusted by: Barbera Setters. Alexandria Lodge III PharmD CACP   Return to anticoagulation clinic:  11/01/2009 Time of next visit: 1630

## 2010-07-12 NOTE — Assessment & Plan Note (Signed)
Summary: ACUTE/2 WEEK RECHECK PER [facility]   Vital Signs:  Patient profile:   25 year old female Height:      63 inches (160.02 cm) Weight:      161.1 pounds (73.09 kg) BMI:     28.59 Temp:     97.7 degrees F (36.50 degrees C) oral Pulse rate:   73 / minute BP sitting:   116 / 76  (left arm) Cuff size:   regular  Vitals Entered By: Theotis Barrio NT II (November 11, 2009 9:42 AM) CC: PATIENT STATES SHE IS HERE FOR 2 WEEK FOLLOW UP APPT / NO CONCERNS - NOR COMPLAINTS Is Patient Diabetic? No Pain Assessment Patient in pain? no      Nutritional Status BMI of 25 - 29 = overweight  Have you ever been in a relationship where you felt threatened, hurt or afraid?No   Does patient need assistance? Functional Status Self care Ambulation Normal Comments PATIENT STATES SHE IS HERE FOR 2 WEEK FOLLOW UP APPT / NO CONCERNS NOR COMPLAINTS   CC:  PATIENT STATES SHE IS HERE FOR 2 WEEK FOLLOW UP APPT / NO CONCERNS - NOR COMPLAINTS.  History of Present Illness: Mrs Lavalais is a 25 yo woman with PMH as outlined below.  She is here for f/u of hematuria noted after starting anticoagulation for bilateral segmental PE.  At the time of the hematuria, she was supratherapeutic with INR of about 7.  She has had no further episodes.  UA, GC, chlamydia all negative at the time.    Still some DOE, no worse than previosly.  Occasional mild pleuritic type chest pain from initial episode and occasional left thigh pain same as on presentation.  Preventive Screening-Counseling & Management  Alcohol-Tobacco     Smoking Status: never  Caffeine-Diet-Exercise     Does Patient Exercise: no      Drug Use:  no.    Medications Prior to Update: 1)  Coumadin 5 Mg Tabs (Warfarin Sodium) .... Take 1 Tablet Daily  Current Medications (verified): 1)  Coumadin 5 Mg Tabs (Warfarin Sodium) .... Take 1 Tablet Daily  Allergies (verified): No Known Drug Allergies  Past History:  Past Medical History: bilateral  segmental PE  Past Surgical History: C section 2005  Family History: No clots  Social History: Occupation:  unemployed Single Never Smoked Alcohol use-no Drug use-no Smoking Status:  never Does Patient Exercise:  no Drug Use:  no  Review of Systems      See HPI  Physical Exam  General:  alert, well-developed, and well-nourished.   Eyes:  anicteric Neck:  supple and full ROM.   Lungs:  normal respiratory effort, no intercostal retractions, no accessory muscle use, and normal breath sounds.   Heart:  normal rate, regular rhythm, no murmur, no gallop, and no rub.   Abdomen:  normal bowel sounds.   Msk:  no calf or thigh tenderness Extremities:  no edema or tenderness Neurologic:  alert & oriented X3 and gait normal.   Psych:  Oriented X3, memory intact for recent and remote, and normally interactive.     Impression & Recommendations:  Problem # 1:  GROSS HEMATURIA (ICD-599.71)  Resolved Will recheck UA today, INR 2.2 recently  Orders: T-Urinalysis ([id number])  Problem # 2:  PE (ICD-415.19)  thought to be 2/2 OCP assume she will need anticoagulation for about 6 months, then reassessment reiterated need for contraception, no OCP.....condoms, IUD etc..  Her updated medication list for this problem includes:  Coumadin 5 Mg Tabs (Warfarin sodium) .Marland Kitchen... Take 1 tablet daily  Reviewed the following: PT: 17.8 (10/07/2009)   INR: 2.2 (11/01/2009)    Coumadin Dose (weekly): 37.5mg /week (11/01/2009) Prior Coumadin Dose (weekly): 37.5mg /week (11/01/2009) Next Protime: 11/15/2009 (dated on 11/01/2009)  Complete Medication List: 1)  Coumadin 5 Mg Tabs (Warfarin sodium) .... Take 1 tablet daily  Patient Instructions: 1)  Please schedule a follow-up appointment in 3 months. 2)  Make sure to follow up in coumadin clinic with Dr. Alexandria Lodge. 3)  Need to use contraception as discussed. 4)  If you have any problems, call clinic.  Process Orders Check Orders Results:      [facility]: ABN not required for this insurance Order queued for requisitioning for [facility]: November 11, 2009 10:05 AM  Tests Sent for requisitioning (November 11, 2009 10:05 AM):     11/11/2009: [facility] -- T-Urinalysis [[id number]] (signed)    Prevention & Chronic Care Immunizations   Influenza vaccine: Not documented    Tetanus booster: Not documented    Pneumococcal vaccine: Not documented  Other Screening   Pap smear: Not documented   Smoking status: never  (11/11/2009)

## 2010-07-12 NOTE — Assessment & Plan Note (Signed)
Summary: COU/CH  Anticoagulant Therapy Managed by: Barbera Setters. Janie Morning  PharmD CACP [facility] Attending: Margarito Liner MD Indication 1: Pulmonary  embolus Indication 2: Encounter for therapeutic drug monitoring  V58.83 Start date: 10/03/2009 Duration: 1 year  Patient Assessment Reviewed by: Chancy Milroy PharmD  December 27, 2009 Medication review: verified warfarin dosage & schedule,verified previous prescription medications, verified doses & any changes, verified new medications, reviewed OTC medications, reviewed OTC health products-vitamins supplements etc Complications: none Dietary changes: none   Health status changes: none   Lifestyle changes: none   Recent/future hospitalizations: none   Recent/future procedures: none   Recent/future dental: none Patient Assessment Part 2:  Have you MISSED ANY DOSES or CHANGED TABLETS?  No missed Warfarin doses or changed tablets.  Have you had any BRUISING or BLEEDING ( nose or gum bleeds,blood in urine or stool)?  No reported bruising or bleeding in nose, gums, urine, stool.  Have you STARTED or STOPPED any MEDICATIONS, including OTC meds,herbals or supplements?  No other medications or herbal supplements were started or stopped.  Have you CHANGED your DIET, especially green vegetables,or ALCOHOL intake?  No changes in diet or alcohol intake.  Have you had any ILLNESSES or HOSPITALIZATIONS?  No reported illnesses or hospitalizations  Have you had any signs of CLOTTING?(chest discomfort,dizziness,shortness of breath,arms tingling,slurred speech,swelling or redness in leg)    No chest discomfort, dizziness, shortness of breath, tingling in arm, slurred speech, swelling, or redness in leg.     Treatment  Target INR: 2.0-3.0 INR: 2.7  Date: 12/27/2009 Regimen In:  32.5mg /week INR reflects regimen in: 2.7  New  Tablet strength: : 5mg  Regimen Out:     Sunday: 1 Tablet     Monday: 1 Tablet     Tuesday: 1 Tablet     Wednesday: 1/2 Tablet   Thursday: 1 Tablet      Friday: 1 Tablet     Saturday: 1 Tablet Total Weekly: 32.5mg/week mg  Next INR Due: 01/24/2010 Adjusted by: Charlena Haub B. Tamberly Pomplun III PharmD CACP   Return to anticoagulation clinic:  01/24/2010 Time of next visit: 1615    Allergies: No Known Drug Allergies  Process Orders Check Orders Results:     [facility]: ABN not required for this insurance Tests Sent for requisitioning (December 27, 2009 4:47 PM):     07 /18/2011: [facility] -- T-Protime, Auto [[id number]] (signed)

## 2010-07-12 NOTE — Assessment & Plan Note (Signed)
Summary: COU/CH  Anticoagulant Therapy Managed by: Barbera Setters. Janie Morning  PharmD CACP [facility] Attending: Coralee Pesa MD, Levada Schilling Indication 1: Pulmonary  embolus Indication 2: Encounter for therapeutic drug monitoring  V58.83 Start date: 10/03/2009 Duration: 1 year  Patient Assessment Reviewed by: Chancy Milroy PharmD  February 25, 2010 Medication review: verified warfarin dosage & schedule,verified previous prescription medications, verified doses & any changes, verified new medications, reviewed OTC medications, reviewed OTC health products-vitamins supplements etc Complications: pregnancy/fetal Comments: Patient states conception occurred near end of August. She was seen in the [facility] at [facility] today where she discussed continuation vs. termination of pregnancy--since pregnancy was conceived while on warfarin therapy. She states she was counseled to wait for 3 more weeks before making a decision regarding termination of pregnancy.  Dietary changes: none   Health status changes: none   Lifestyle changes: none   Recent/future hospitalizations: none   Recent/future procedures: none   Recent/future dental: none Patient Assessment Part 2:  Have you MISSED ANY DOSES or CHANGED TABLETS?  She is NOT on warfarin. It was DISCONTINUED UPON ADMISSION for PE and the determination that she was pregnant.  Have you had any BRUISING or BLEEDING ( nose or gum bleeds,blood in urine or stool)?  No reported bruising or bleeding in nose, gums, urine, stool.  Have you STARTED or STOPPED any MEDICATIONS, including OTC meds,herbals or supplements?  No other medications or herbal supplements were started or stopped.  Have you CHANGED your DIET, especially green vegetables,or ALCOHOL intake?  No changes in diet or alcohol intake.  Have you had any ILLNESSES or HOSPITALIZATIONS?  Recent admission and discharge with new diagnosis of PE.  Have you had any signs of CLOTTING?(chest  discomfort,dizziness,shortness of breath,arms tingling,slurred speech,swelling or redness in leg)    No chest discomfort, dizziness, shortness of breath, tingling in arm, slurred speech, swelling, or redness in leg.     Treatment  Target INR: 2.0-3.0 Regimen In:  35.0mg /week       Comments: She is on 1mg /kg subcutaneously q12h of Lovenox. She was provided 30 syringes today. She is getting signed up for the indigent care plan with the manufacturer for continued provision of Lovenox.  Lovenox: Previous results:    INR: 2.5  Date: 01/24/2010 Reviewed laboratory results. Reviewed height & weight. Inclusion Criteria There will be adequate patient, caregiver, and/or home nursing support. yes Patient is stable with no obvious indication of major pulmonary embolism. yes Contraindications: History of CVA known to be hemorrhagic no Recent bleeding (eg PUD,hematuria) no Any bleeding and/or hematological disorder (eg. coagulopathy, Hb<8.0, thrombocytopenia) no Severe uncontrolled hypertension (SBP180 DBP110) no Renal failure (Creatinine>3.0 mg/dL) and/or hepatic failure no Comments: She is stable with respect to her PE.  Allergies: No Known Drug Allergies

## 2010-07-12 NOTE — Assessment & Plan Note (Signed)
Summary: COU/CH  Anticoagulant Therapy Managed by: Barbera Setters. Janie Morning  PharmD CACP O[facility] Attending: Coralee Pesa MD, Levada Schilling Indication 1: Pulmonary  embolus Indication 2: Encounter for therapeutic drug monitoring  V58.83 Start date: 10/03/2009 Duration: 1 year  Patient Assessment Reviewed by: Chancy Milroy PharmD  January 24, 2010 Medication review: verified warfarin dosage & schedule,verified previous prescription medications, verified doses & any changes, verified new medications, reviewed OTC medications, reviewed OTC health products-vitamins supplements etc Complications: none Dietary changes: none   Health status changes: none   Lifestyle changes: none   Recent/future hospitalizations: none   Recent/future procedures: none   Recent/future dental: none Patient Assessment Part 2:  Have you MISSED ANY DOSES or CHANGED TABLETS?  No missed Warfarin doses or changed tablets.  Have you had any BRUISING or BLEEDING ( nose or gum bleeds,blood in urine or stool)?  No reported bruising or bleeding in nose, gums, urine, stool.  Have you STARTED or STOPPED any MEDICATIONS, including OTC meds,herbals or supplements?  No other medications or herbal supplements were started or stopped.  Have you CHANGED your DIET, especially green vegetables,or ALCOHOL intake?  No changes in diet or alcohol intake.  Have you had any ILLNESSES or HOSPITALIZATIONS?  No reported illnesses or hospitalizations  Have you had any signs of CLOTTING?(chest discomfort,dizziness,shortness of breath,arms tingling,slurred speech,swelling or redness in leg)    No chest discomfort, dizziness, shortness of breath, tingling in arm, slurred speech, swelling, or redness in leg.     Treatment  Target INR: 2.0-3.0 INR: 2.5  Date: 01/24/2010 Regimen In:  32.5mg /week INR reflects regimen in: 2.5  New  Tablet strength: : 5mg  Regimen Out:     Sunday: 1 Tablet     Monday: 1 Tablet     Tuesday: 1 Tablet     Wednesday: 1  Tablet     Thursday: 1 Tablet      Friday: 1 Tablet     Saturday: 1 Tablet Total Weekly: 35.0mg/week mg  Next INR Due: 02/21/2010 Adjusted by: Ransom Nickson B. Angelis Gates III PharmD CACP   Return to anticoagulation clinic:  02/21/2010 Time of next visit: 1630    Allergies: No Known Drug Allergies Prescriptions: COUMADIN 5 MG TABS (WARFARIN SODIUM) Take 1 tablet daily  #31 x 1   Entered by:   Jay Seren Chaloux PharmD   Authorized by:   Wynne E Woodyear MD   Signed by:   Jay Mckayla Mulcahey PharmD on 01/24/2010   Method used:   Electronically to        [id number] (retail)       [address]       [place], [place]  [address]       Ph: [phone]       Fax: [phone]   RxID:   [id number]

## 2010-07-12 NOTE — Assessment & Plan Note (Signed)
Summary: COU/CH  Anticoagulant Therapy Managed by: Barbera Setters. Janie Morning  PharmD CACP [facility] Attending: Coralee Pesa MD, Levada Schilling Indication 1: Pulmonary  embolus Indication 2: Encounter for therapeutic drug monitoring  V58.83 Start date: 10/03/2009 Duration: 1 year  Patient Assessment Reviewed by: Chancy Milroy PharmD  November 15, 2009 Medication review: verified warfarin dosage & schedule,verified previous prescription medications, verified doses & any changes, verified new medications, reviewed OTC medications, reviewed OTC health products-vitamins supplements etc Complications: none Dietary changes: none   Health status changes: none   Lifestyle changes: none   Recent/future hospitalizations: none   Recent/future procedures: none   Recent/future dental: none Patient Assessment Part 2:  Have you MISSED ANY DOSES or CHANGED TABLETS?  No missed Warfarin doses or changed tablets.  Have you had any BRUISING or BLEEDING ( nose or gum bleeds,blood in urine or stool)?  No reported bruising or bleeding in nose, gums, urine, stool.  Have you STARTED or STOPPED any MEDICATIONS, including OTC meds,herbals or supplements?  No other medications or herbal supplements were started or stopped.  Have you CHANGED your DIET, especially green vegetables,or ALCOHOL intake?  No changes in diet or alcohol intake.  Have you had any ILLNESSES or HOSPITALIZATIONS?  No reported illnesses or hospitalizations  Have you had any signs of CLOTTING?(chest discomfort,dizziness,shortness of breath,arms tingling,slurred speech,swelling or redness in leg)    No chest discomfort, dizziness, shortness of breath, tingling in arm, slurred speech, swelling, or redness in leg.     Treatment  Target INR: 2.0-3.0 INR: 5.0  Date: 11/15/2009 Regimen In:  37.5mg /week INR reflects regimen in: 5.0  New  Tablet strength: : 5mg  Regimen Out:     Sunday: 1 Tablet     Monday: 1 Tablet     Tuesday: 1 Tablet     Wednesday: 1/2  Tablet     Thursday: 1 Tablet      Friday: 1 Tablet     Saturday: 1 Tablet Total Weekly: 32.5mg /week mg  Next INR Due: 11/29/2009 Adjusted by: Barbera Setters. Alexandria Lodge III PharmD CACP   Return to anticoagulation clinic:  11/29/2009 Time of next visit: 1630  Hold:  1 Days     Allergies: No Known Drug Allergies

## 2010-07-12 NOTE — Progress Notes (Signed)
Summary: refill/ hla  Phone Note Refill Request Message from:  dr Alexandria Lodge on April 08, 2010 12:29 PM  Refills Requested: Medication #1:  COUMADIN 7.5 MG TABS.   Dosage confirmed as above?Dosage Confirmed Initial call taken by: Marin Roberts RN,  April 08, 2010 12:29 PM  Follow-up for Phone Call        Dr. Saralyn Pilar last note states coumadin 5mg  tablets and she alternates 5mg  with 7.5mg .  With that dosing, 5mg  tablets would be easier to manage.  Can we find out what pt has been taking? Follow-up by: Mariea Stable MD,  April 08, 2010 12:37 PM

## 2010-07-12 NOTE — Progress Notes (Signed)
Summary: coumadin, prenancy/ hla  Phone Note Call from Patient   Summary of Call: pt calls to ask about using plan b and coumadin, states she does not want to be pregnant, incident occurred 7/4, spoke w/ [place] groce and dr Onalee Hua, spoke w/ pt again and she waivers stating she might want to be pregnant but the "dr's told me i shouldn't get preganant" i then responded that it may not be ideal but that there were other meds available if she chose to be pregnant so not to let that determine pregnancy or not, it was her choice. i also explained that normally plan b would cause a men. period to begin and it may be heavier than normal but with coumadin it may be even heavier but that if she was using more than 1 peri pad per hour she should either call or go to the ED preferably women's ed or [facility]. she also request an appt and it is scheduled on 7/11. she verb acknowledges understanding Initial call taken by: Marin Roberts RN,  December 14, 2009 4:56 PM  Follow-up for Phone Call        Agree with note.  Again, pt should seek attention if bleeding exceeds her normal menses after taking plan B.   Follow-up by: Mariea Stable MD,  December 14, 2009 5:17 PM

## 2010-07-12 NOTE — Assessment & Plan Note (Signed)
Summary: COU/CH  Anticoagulant Therapy Managed by: Barbera Setters. Janie Morning  PharmD CACP PCP: Rosana Berger MD [facility] Attending: Lowella Bandy MD Indication 1: Pulmonary  embolus Indication 2: Encounter for therapeutic drug monitoring  V58.83 Start date: 10/03/2009 Duration: 1 year  Patient Assessment Reviewed by: Chancy Milroy PharmD  April 25, 2010 Medication review: verified warfarin dosage & schedule,verified previous prescription medications, verified doses & any changes, verified new medications, reviewed OTC medications, reviewed OTC health products-vitamins supplements etc Complications: none Dietary changes: none   Health status changes: none   Lifestyle changes: none   Recent/future hospitalizations: none   Recent/future procedures: none   Recent/future dental: none Patient Assessment Part 2:  Have you MISSED ANY DOSES or CHANGED TABLETS?  No missed Warfarin doses or changed tablets.  Have you had any BRUISING or BLEEDING ( nose or gum bleeds,blood in urine or stool)?  No reported bruising or bleeding in nose, gums, urine, stool.  Have you STARTED or STOPPED any MEDICATIONS, including OTC meds,herbals or supplements?  No other medications or herbal supplements were started or stopped.  Have you CHANGED your DIET, especially green vegetables,or ALCOHOL intake?  No changes in diet or alcohol intake.  Have you had any ILLNESSES or HOSPITALIZATIONS?  No reported illnesses or hospitalizations  Have you had any signs of CLOTTING?(chest discomfort,dizziness,shortness of breath,arms tingling,slurred speech,swelling or redness in leg)    No chest discomfort, dizziness, shortness of breath, tingling in arm, slurred speech, swelling, or redness in leg.     Treatment  Target INR: 2.0-3.0 INR: 2.8  Date: 04/25/2010 Regimen In:  42.5mg /week INR reflects regimen in: 2.8  New  Tablet strength: : 5mg  Regimen Out:     Sunday: 1 Tablet     Monday: 1 & 1/2 Tablet     Tuesday: 1  Tablet     Wednesday: 1 & 1/2 Tablet     Thursday: 1 Tablet      Friday: 1 & 1/2 Tablet     Saturday: 1 Tablet Total Weekly: 42.5mg /week mg  Next INR Due: 05/23/2010 Adjusted by: Barbera Setters. Alexandria Lodge III PharmD CACP   Return to anticoagulation clinic:  05/23/2010 Time of next visit: 1615    Allergies: No Known Drug Allergies

## 2010-07-12 NOTE — Assessment & Plan Note (Signed)
Summary: PT/INR PER DR.RIOFRIO/[facility]  Anticoagulant Therapy Managed by: Barbera Setters. Alexandria Lodge III  PharmD CACP Indication 1: Pulmonary  embolus Indication 2: Encounter for therapeutic drug monitoring  V58.83 Start date: 10/03/2009 Duration: 1 year  Patient Assessment Reviewed by: Chancy Milroy PharmD  Oct 11, 2009 Medication review: verified warfarin dosage & schedule,verified previous prescription medications, verified doses & any changes, verified new medications, reviewed OTC medications, reviewed OTC health products-vitamins supplements etc Complications: none Dietary changes: none   Health status changes: none   Lifestyle changes: none   Recent/future hospitalizations: none   Recent/future procedures: none   Recent/future dental: none Patient Assessment Part 2:  Have you MISSED ANY DOSES or CHANGED TABLETS?  No missed Warfarin doses or changed tablets.  Have you had any BRUISING or BLEEDING ( nose or gum bleeds,blood in urine or stool)?  No reported bruising or bleeding in nose, gums, urine, stool.  Have you STARTED or STOPPED any MEDICATIONS, including OTC meds,herbals or supplements?  No other medications or herbal supplements were started or stopped.  Have you CHANGED your DIET, especially green vegetables,or ALCOHOL intake?  No changes in diet or alcohol intake.  Have you had any ILLNESSES or HOSPITALIZATIONS?  No reported illnesses or hospitalizations  Have you had any signs of CLOTTING?(chest discomfort,dizziness,shortness of breath,arms tingling,slurred speech,swelling or redness in leg)    No chest discomfort, dizziness, shortness of breath, tingling in arm, slurred speech, swelling, or redness in leg.     Treatment  Target INR: 2.0-3.0 INR: 5.4  Date: 10/11/2009 INR reflects regimen in: 5.4  New  Tablet strength: : 5mg  Regimen Out:     Sunday: 1 Tablet     Monday: 1 Tablet     Tuesday: 1 Tablet     Wednesday: 1 Tablet     Thursday: 1 Tablet      Friday: 1  Tablet     Saturday: 1 Tablet Total Weekly: 35.0mg /week mg  Next INR Due: 10/18/2009 Adjusted by: Barbera Setters. Alexandria Lodge III PharmD CACP   Return to anticoagulation clinic:  10/18/2009 Time of next visit: 1000

## 2010-07-12 NOTE — Miscellaneous (Signed)
Summary: hosp d/c  Hospital Discharge  Date of admission: 10/03/09  Date of discharge: 10/04/09  Brief reason for admission/active problems: Admitted for chest pain and L leg pain, found to have PE on CTA. Pt's leg pain seems to be more consistent with sciatica; there is no leg swelling or tenderness.  Followup needed: She has a lab appt on April 28 @ 11:30 am for PT/INR and an appt with Dr. Alexandria Lodge in [facility] on May 2 @ 2:15 pm. She also has a follow-up appt with Dr. Sherryll Burger on May 25 @ 1:30 pm. At this appointment, please review her hypercoagulable panel in Echart as this was not back by the time of discharge. Also, she has been advised to not take OCPs any longer and instead use condoms. Please discuss other non-hormonal contraceptive options with her.  The medication and problem lists have been updated.  Please see the dictated discharge summary for details.   Complete Medication List: 1)  Lovenox 120 Mg/0.14ml Soln (Enoxaparin sodium) .... Inject 110 units subcutaneously daily x 5 days 2)  Coumadin 5 Mg Tabs (Warfarin sodium) .... Take 1 tablet daily 3)  Vicodin 5-500 Mg Tabs (Hydrocodone-acetaminophen) .... Take 1 tablet every six hours as needed for pain  Other Orders: Future Orders: T-Protime, Auto ([id number]) ... 10/07/2009   Patient Instructions: 1)  You have the following appointments scheduled: 2)  At the Outpatient Clinic Lab on April 28 @ 11:30 am. 3)  With Dr. Alexandria Lodge in [facility] on May 2 @ 2:15 pm.  4)  With Dr. Sherryll Burger at the Outpatient Clinic on May 25 @ 1:30 pm.  5)  Please do NOT take any more birth control pills. Use condoms for contraception. 6)  It is very important that you keep all these appointments and that you take all medications as prescribed. Please call the clinic at [phone] if you have any questions or concerns.

## 2010-07-12 NOTE — Initial Assessments (Signed)
INTERNAL MEDICINE ADMISSION HISTORY AND PHYSICAL  PCP: Rosana Berger  ATTENDING 1st contact: Manson Passey [phone] 2nd contact: Devani [phone] Nights and weekends: [phone]; [phone]  CC: Chest pain  HPI: This is a 25 year old female with a history of PE in 4/24/11presenting with chest pain. The patient has been experiencing chest pain intermittently for approximately 1 week. The pain is sharp, substernal, worse on deep inspiration, and does not radiate. The pain is similar to that experienced during her last PE.  The patient has also experienced SOB for the last 3 months which is not associated with the chest pain. The patient went to the ER yesterday because of the chest pain, where a urine pregnancy test was positive (her last menstrual period was 01/22/10). The patient has been taking her coumadin regularly as prescribed and her INR was 2.17 in the ED yesterday. The patient denies any easy bleeding or bruising. She describes some soreness in her left lower leg which comes and goes, but denies any pain or swelling. She denies any fevers or palpitations.  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 She is currently unemployed. She has never smoked, drinks alcohol occasionally, and denies illicit drug use.    FAMILY HISTORY Family History: No hx of thromboembolism    VITALS: T: 98.3  P: 74  BP: 114/75 R: 18 O2SAT: 99  ON: RA  PHYSICAL EXAM: Gen: well-nourished young woman in NAD HEENT: NCAT, PERRL, EOMI, neck supple w/o thyromegaly or lymphadanopathy; OP clear and without lesions or exudates CVS: RRR, nl s1/s2, no murmurs Resp: Lungs clear to auscultation bilaterally Abd: Soft, non-tender, non-distended. +BS Neuro: CN II-X!! intact, normal sensation and strength Extremity: no edema, no tenderness, erythema, or swelling of lower  extremities  LABS: Findings  Result Name                              Result     Abnl   Normal Range     Units        WBC                                      4.8               4.0-10.5         K/uL  RBC                                      4.30              3.87-5.11        MIL/uL  Hemoglobin (HGB)                         12.5              12.0-15.0        g/dL  Hematocrit (HCT)                         35.6       l      36.0-46.0        %  MCV                                      82.8              78.0-100.0       fL  MCH -                                    29.1              26.0-34.0        pg  MCHC                                     35.1              30.0-36.0        g/dL  RDW                                      12.8              11.5-15.5        %  Platelet Count (PLT)                     288               150-400          K/uL  Sodium (NA)                              137               135-145          mEq/L  Potassium (K)                            3.9               3.5-5.1          mEq/L  Chloride                                 104               96-112           mEq/L  CO2                                      26                19-32            mEq/L  Glucose                                  88                70-99  mg/dL  BUN                                      5          l      6-23             mg/dL  Creatinine                               0.62              0.4-1.2          mg/dL  GFR, Est Non African American            >60               >60              mL/min  GFR, Est African American                >60               >60              mL/min    Oversized comment, see footnote  1  Calcium                                  9.3               8.4-10.5         mg/dL    Findings  Result Name                              Result     Abnl   Normal Range     Units      Perf. Loc.  D-Dimer, Fibrin Derivatives              <0.22             0.00-0.48        ug/mL-FEU     Oversized comment, see footnote  1    Findings Result Name                              Result     Abnl   Normal Range     Units      Perf. Loc. Protime ( Prothrombin Time)              22.8       h      11.6-15.2        seconds INR                                      2.00       h      0.00-1.49 PTT(a-Partial Thromboplastn Time)        43         h      24-37            seconds   ASSESSMENT AND PLAN: 1. Chest pain: Suspicion for PE is high in  this young woman with a previous PE. CTA is the study of choice in non-pregnant patients, however it carries potential for radiation exposure to the fetus. V/Q scans are more accurate than CTA in pregnant patients, however deliver a larger dose of radiation to the fetus. A low percentage of patients with PE have positive lower extremity dopplers so negative LE dopplers should not rule out a PE in the setting of high clinical suspicion.  She was counseled about the potential risks of radiation exposure, and agreed to undergo CTA. In the setting of high clinical suspicion we will start anticoagulation with LMWH. --CTA --Anticoagulation with LMWH (dose: 1mg /kg [place] once daily) 2. Pregnancy: Patient had positive UPT in ED yesterday. She did not previously know she was pregnant and has not received prenatal care. She is considering aborting this fetus if there is a large chance of congenital malformations.  --d/c warfarin --OB/GYN consult for options counseling

## 2010-07-12 NOTE — Assessment & Plan Note (Signed)
Summary: COU/CH  Anticoagulant Therapy Managed by: Barbera Setters. Janie Morning  PharmD CACP [facility] Attending: Rogelia Boga MD, Lanora Manis Indication 1: Pulmonary  embolus Indication 2: Encounter for therapeutic drug monitoring  V58.83 Start date: 10/03/2009 Duration: 1 year  Patient Assessment Reviewed by: Chancy Milroy PharmD  November 29, 2009 Medication review: verified warfarin dosage & schedule,verified previous prescription medications, verified doses & any changes, verified new medications, reviewed OTC medications, reviewed OTC health products-vitamins supplements etc Complications: none Dietary changes: none   Health status changes: none   Lifestyle changes: none   Recent/future hospitalizations: none   Recent/future procedures: none   Recent/future dental: none Patient Assessment Part 2:  Have you MISSED ANY DOSES or CHANGED TABLETS?  No missed Warfarin doses or changed tablets.  Have you had any BRUISING or BLEEDING ( nose or gum bleeds,blood in urine or stool)?  No reported bruising or bleeding in nose, gums, urine, stool.  Have you STARTED or STOPPED any MEDICATIONS, including OTC meds,herbals or supplements?  No other medications or herbal supplements were started or stopped.  Have you CHANGED your DIET, especially green vegetables,or ALCOHOL intake?  No changes in diet or alcohol intake.  Have you had any ILLNESSES or HOSPITALIZATIONS?  No reported illnesses or hospitalizations  Have you had any signs of CLOTTING?(chest discomfort,dizziness,shortness of breath,arms tingling,slurred speech,swelling or redness in leg)    No chest discomfort, dizziness, shortness of breath, tingling in arm, slurred speech, swelling, or redness in leg.     Treatment  Target INR: 2.0-3.0 INR: 2.8  Date: 11/29/2009 Regimen In:  32.5mg /week INR reflects regimen in: 2.8  New  Tablet strength: : 5mg  Regimen Out:     Sunday: 1 Tablet     Monday: 1 Tablet     Tuesday: 1 Tablet     Wednesday: 1/2  Tablet     Thursday: 1 Tablet      Friday: 1 Tablet     Saturday: 1 Tablet Total Weekly: 32.5mg /week mg  Next INR Due: 12/20/2009 Adjusted by: Barbera Setters. Alexandria Lodge III PharmD CACP   Return to anticoagulation clinic:  12/20/2009 Time of next visit: 1645    Allergies: No Known Drug Allergies

## 2010-07-12 NOTE — Assessment & Plan Note (Signed)
Summary: 261/PAGE JAY UPON PT ARRIVAL/DS  Anticoagulant Therapy Managed by: Barbera Setters. Janie Morning  PharmD CACP [facility] Attending: Coralee Pesa MD, Levada Schilling Indication 1: Pulmonary  embolus Indication 2: Encounter for therapeutic drug monitoring  V58.83 Start date: 10/03/2009 Duration: 1 year  Patient Assessment Reviewed by: Chancy Milroy PharmD  Oct 20, 2009 Medication review: verified warfarin dosage & schedule,verified previous prescription medications, verified doses & any changes, verified new medications, reviewed OTC medications, reviewed OTC health products-vitamins supplements etc Complications: none Dietary changes: none   Health status changes: none   Lifestyle changes: none   Recent/future hospitalizations: none   Recent/future procedures: none   Recent/future dental: none Patient Assessment Part 2:  Have you MISSED ANY DOSES or CHANGED TABLETS?  YES. Has omitted TWO DAYS of warfarin at my instruction.  Have you had any BRUISING or BLEEDING ( nose or gum bleeds,blood in urine or stool)?  No reported bruising or bleeding in nose, gums, urine, stool.  Have you STARTED or STOPPED any MEDICATIONS, including OTC meds,herbals or supplements?  No other medications or herbal supplements were started or stopped.  Have you CHANGED your DIET, especially green vegetables,or ALCOHOL intake?  No changes in diet or alcohol intake.  Have you had any ILLNESSES or HOSPITALIZATIONS?  No reported illnesses or hospitalizations  Have you had any signs of CLOTTING?(chest discomfort,dizziness,shortness of breath,arms tingling,slurred speech,swelling or redness in leg)    No chest discomfort, dizziness, shortness of breath, tingling in arm, slurred speech, swelling, or redness in leg.     Treatment  Target INR: 2.0-3.0 INR: 3.1  Date: 10/20/2009 Regimen In:  0mg /wk INR reflects regimen in: 3.1  New  Tablet strength: : 5mg  Regimen Out:     Sunday: 1/2 Tablet     Monday: INR Tablet  Wednesday: 1 Tablet     Thursday: 1/2 Tablet      Friday: 1 Tablet     Saturday: 1/2 Tablet Total Weekly: 17.5mg /5 days mg  Next INR Due: 10/25/2009 Adjusted by: Barbera Setters. Alexandria Lodge III PharmD CACP   Return to anticoagulation clinic:  10/25/2009 Time of next visit: 0930   Comments: Will administer 17.5mg /5 days as:  5mg  on Wednesday/Friday; 1/2 x 5mg  (2.5mg ) on Th/Sa/Su, RTC on Monday 16-May-11 at 0930h.

## 2010-07-14 NOTE — Assessment & Plan Note (Signed)
Summary: coumadin  Anticoagulant Therapy Managed by: Barbera Setters. Janie Morning  PharmD CACP PCP: Rosana Berger MD Indication 1: Pulmonary  embolus Indication 2: Encounter for therapeutic drug monitoring  V58.83 Start date: 10/03/2009 Duration: 1 year  Patient Assessment Reviewed by: Chancy Milroy PharmD  May 30, 2010 Medication review: verified warfarin dosage & schedule,verified previous prescription medications, verified doses & any changes, verified new medications, reviewed OTC medications, reviewed OTC health products-vitamins supplements etc Complications: none Dietary changes: none   Health status changes: none   Lifestyle changes: none   Recent/future hospitalizations: none   Recent/future procedures: none   Recent/future dental: none Patient Assessment Part 2:  Have you MISSED ANY DOSES or CHANGED TABLETS?  No missed Warfarin doses or changed tablets.  Have you had any BRUISING or BLEEDING ( nose or gum bleeds,blood in urine or stool)?  No reported bruising or bleeding in nose, gums, urine, stool.  Have you STARTED or STOPPED any MEDICATIONS, including OTC meds,herbals or supplements?  No other medications or herbal supplements were started or stopped.  Have you CHANGED your DIET, especially green vegetables,or ALCOHOL intake?  No changes in diet or alcohol intake.  Have you had any ILLNESSES or HOSPITALIZATIONS?  No reported illnesses or hospitalizations  Have you had any signs of CLOTTING?(chest discomfort,dizziness,shortness of breath,arms tingling,slurred speech,swelling or redness in leg)    No chest discomfort, dizziness, shortness of breath, tingling in arm, slurred speech, swelling, or redness in leg.     Treatment  Target INR: 2.0-3.0 INR: 4.2  Date: 05/30/2010 Regimen In:  42.5mg /week INR reflects regimen in: 4.2  New  Tablet strength: : 5mg  Regimen Out:     Sunday: 1 Tablet     Monday: 1 Tablet     Tuesday: 1 Tablet     Wednesday: 1 & 1/2 Tablet  Thursday: 1 Tablet      Friday: 1 Tablet     Saturday: 1 Tablet Total Weekly: 37.5mg /week mg  Next INR Due: 06/20/2010 Adjusted by: Barbera Setters. Alexandria Lodge III PharmD CACP   Return to anticoagulation clinic:  06/20/2010 Time of next visit: 1130    Allergies: No Known Drug Allergies

## 2010-07-20 NOTE — Assessment & Plan Note (Signed)
Summary: COU/SB.  Anticoagulant Therapy Managed by: Barbera Setters. Janie Morning  PharmD CACP PCP: Rosana Berger MD [facility] Attending: Lowella Bandy MD Indication 1: Pulmonary  embolus Indication 2: Encounter for therapeutic drug monitoring  V58.83 Start date: 10/03/2009 Duration: 1 year  Patient Assessment Reviewed by: Chancy Milroy PharmD  July 11, 2010 Medication review: verified warfarin dosage & schedule,verified previous prescription medications, verified doses & any changes, verified new medications, reviewed OTC medications, reviewed OTC health products-vitamins supplements etc Complications: none Dietary changes: none   Health status changes: none   Lifestyle changes: none   Recent/future hospitalizations: none   Recent/future procedures: none   Recent/future dental: none Patient Assessment Part 2:  Have you MISSED ANY DOSES or CHANGED TABLETS?  No missed Warfarin doses or changed tablets.  Have you had any BRUISING or BLEEDING ( nose or gum bleeds,blood in urine or stool)?  No reported bruising or bleeding in nose, gums, urine, stool.  Have you STARTED or STOPPED any MEDICATIONS, including OTC meds,herbals or supplements?  No other medications or herbal supplements were started or stopped.  Have you CHANGED your DIET, especially green vegetables,or ALCOHOL intake?  No changes in diet or alcohol intake.  Have you had any ILLNESSES or HOSPITALIZATIONS?  No reported illnesses or hospitalizations  Have you had any signs of CLOTTING?(chest discomfort,dizziness,shortness of breath,arms tingling,slurred speech,swelling or redness in leg)    No chest discomfort, dizziness, shortness of breath, tingling in arm, slurred speech, swelling, or redness in leg.     Treatment  Target INR: 2.0-3.0 INR: 1.4  Date: 07/11/2010 Regimen In:  37.5mg /week INR reflects regimen in: 1.4  New  Tablet strength: : 5mg  Regimen Out:     Sunday: 1 Tablet     Monday: 1 & 1/2 Tablet     Tuesday: 1  Tablet     Wednesday: 1 & 1/2 Tablet     Thursday: 1 Tablet      Friday: 1 & 1/2 Tablet     Saturday: 1 Tablet Total Weekly: 42.5mg /week mg  Next INR Due: 08/01/2010 Adjusted by: Barbera Setters. Alexandria Lodge III PharmD CACP   Return to anticoagulation clinic:  08/01/2010 Time of next visit: 1630    Allergies: No Known Drug Allergies

## 2010-07-25 ENCOUNTER — Ambulatory Visit: Payer: Self-pay

## 2010-07-25 ENCOUNTER — Emergency Department (HOSPITAL_COMMUNITY)
Admission: EM | Admit: 2010-07-25 | Discharge: 2010-07-26 | Disposition: A | Discharge status: Home / Self Care | Payer: Self-pay | Attending: Emergency Medicine | Admitting: Emergency Medicine

## 2010-07-25 ENCOUNTER — Emergency Department (HOSPITAL_COMMUNITY): Payer: Self-pay

## 2010-07-25 DIAGNOSIS — Z86711 Personal history of pulmonary embolism: Secondary | ICD-10-CM | POA: Insufficient documentation

## 2010-07-25 DIAGNOSIS — Z86718 Personal history of other venous thrombosis and embolism: Secondary | ICD-10-CM | POA: Insufficient documentation

## 2010-07-25 DIAGNOSIS — R0789 Other chest pain: Secondary | ICD-10-CM | POA: Insufficient documentation

## 2010-07-25 LAB — CBC
HCT: 37.6 % (ref 36.0–46.0)
Hemoglobin: 12.9 g/dL (ref 12.0–15.0)
MCH: 28.4 pg (ref 26.0–34.0)
MCHC: 34.3 g/dL (ref 30.0–36.0)
MCV: 82.6 fL (ref 78.0–100.0)
Platelets: 247 10*3/uL (ref 150–400)
RBC: 4.55 MIL/uL (ref 3.87–5.11)
RDW: 12.8 % (ref 11.5–15.5)
WBC: 5.6 10*3/uL (ref 4.0–10.5)

## 2010-07-25 LAB — COMPREHENSIVE METABOLIC PANEL
ALT: 14 U/L (ref 0–35)
AST: 19 U/L (ref 0–37)
Albumin: 3.8 g/dL (ref 3.5–5.2)
Alkaline Phosphatase: 53 U/L (ref 39–117)
BUN: 7 mg/dL (ref 6–23)
CO2: 26 mEq/L (ref 19–32)
Calcium: 9.4 mg/dL (ref 8.4–10.5)
Chloride: 107 mEq/L (ref 96–112)
Creatinine, Ser: 0.7 mg/dL (ref 0.4–1.2)
GFR calc Af Amer: >60 mL/min (ref >60)
GFR calc non Af Amer: >60 mL/min (ref >60)
Glucose, Bld: 100 mg/dL — ABNORMAL HIGH (ref 70–99)
Potassium: 3.8 mEq/L (ref 3.5–5.1)
Sodium: 140 mEq/L (ref 135–145)
Total Bilirubin: 0.5 mg/dL (ref 0.3–1.2)
Total Protein: 7.3 g/dL (ref 6.0–8.3)

## 2010-07-25 LAB — DIFFERENTIAL
Basophils Absolute: 0 10*3/uL (ref 0.0–0.1)
Basophils Relative: 0 % (ref 0–1)
Eosinophils Absolute: 0.1 10*3/uL (ref 0.0–0.7)
Eosinophils Relative: 2 % (ref 0–5)
Lymphocytes Relative: 60 % — ABNORMAL HIGH (ref 12–46)
Lymphs Abs: 3.3 10*3/uL (ref 0.7–4.0)
Monocytes Absolute: 0.2 10*3/uL (ref 0.1–1.0)
Monocytes Relative: 4 % (ref 3–12)
Neutro Abs: 1.9 10*3/uL (ref 1.7–7.7)
Neutrophils Relative %: 34 % — ABNORMAL LOW (ref 43–77)

## 2010-07-25 LAB — POCT CARDIAC MARKERS
CKMB, poc: <1 ng/mL — ABNORMAL LOW (ref 1.0–8.0)
Myoglobin, poc: 38.3 ng/mL (ref 12–200)
Troponin i, poc: <0.05 ng/mL (ref 0.00–0.09)

## 2010-07-25 LAB — PROTIME-INR
INR: 2.41 — ABNORMAL HIGH (ref 0.00–1.49)
Prothrombin Time: 26.4 seconds — ABNORMAL HIGH (ref 11.6–15.2)

## 2010-07-26 ENCOUNTER — Encounter (HOSPITAL_COMMUNITY): Payer: Self-pay | Admitting: Radiology

## 2010-07-26 MED ORDER — IOHEXOL 300 MG/ML  SOLN
100.0000 mL | Freq: Once | INTRAMUSCULAR | Status: AC | PRN
  Administered 2010-07-26: 100 mL via INTRAVENOUS

## 2010-08-01 ENCOUNTER — Ambulatory Visit: Payer: Self-pay

## 2010-08-01 ENCOUNTER — Ambulatory Visit: Payer: Self-pay | Admitting: Pharmacist

## 2010-08-15 ENCOUNTER — Ambulatory Visit: Payer: Self-pay

## 2010-08-25 LAB — BASIC METABOLIC PANEL
BUN: 5 mg/dL — ABNORMAL LOW (ref 6–23)
BUN: 5 mg/dL — ABNORMAL LOW (ref 6–23)
BUN: 5 mg/dL — ABNORMAL LOW (ref 6–23)
CO2: 23 mEq/L (ref 19–32)
CO2: 26 mEq/L (ref 19–32)
CO2: 26 mEq/L (ref 19–32)
Calcium: 8.9 mg/dL (ref 8.4–10.5)
Calcium: 9.2 mg/dL (ref 8.4–10.5)
Calcium: 9.3 mg/dL (ref 8.4–10.5)
Chloride: 108 mEq/L (ref 96–112)
Chloride: 103 mEq/L (ref 96–112)
Chloride: 104 mEq/L (ref 96–112)
Creatinine, Ser: 0.66 mg/dL (ref 0.4–1.2)
Creatinine, Ser: 0.62 mg/dL (ref 0.4–1.2)
Creatinine, Ser: 0.62 mg/dL (ref 0.4–1.2)
GFR calc Af Amer: >60 mL/min (ref >60)
GFR calc Af Amer: >60 mL/min (ref >60)
GFR calc Af Amer: >60 mL/min (ref >60)
GFR calc non Af Amer: >60 mL/min (ref >60)
GFR calc non Af Amer: >60 mL/min (ref >60)
GFR calc non Af Amer: >60 mL/min (ref >60)
Glucose, Bld: 90 mg/dL (ref 70–99)
Glucose, Bld: 88 mg/dL (ref 70–99)
Glucose, Bld: 90 mg/dL (ref 70–99)
Potassium: 3.6 mEq/L (ref 3.5–5.1)
Potassium: 3.6 mEq/L (ref 3.5–5.1)
Potassium: 3.9 mEq/L (ref 3.5–5.1)
Sodium: 139 mEq/L (ref 135–145)
Sodium: 136 mEq/L (ref 135–145)
Sodium: 137 mEq/L (ref 135–145)

## 2010-08-25 LAB — CBC
HCT: 34.5 % — ABNORMAL LOW (ref 36.0–46.0)
HCT: 35 % — ABNORMAL LOW (ref 36.0–46.0)
HCT: 35.6 % — ABNORMAL LOW (ref 36.0–46.0)
HCT: 36.2 % (ref 36.0–46.0)
Hemoglobin: 11.8 g/dL — ABNORMAL LOW (ref 12.0–15.0)
Hemoglobin: 11.9 g/dL — ABNORMAL LOW (ref 12.0–15.0)
Hemoglobin: 12.5 g/dL (ref 12.0–15.0)
Hemoglobin: 12.7 g/dL (ref 12.0–15.0)
MCH: 27.8 pg (ref 26.0–34.0)
MCH: 29.7 pg (ref 26.0–34.0)
MCH: 28.9 pg (ref 26.0–34.0)
MCH: 29.1 pg (ref 26.0–34.0)
MCHC: 34.2 g/dL (ref 30.0–36.0)
MCHC: 34.1 g/dL (ref 30.0–36.0)
MCHC: 35.1 g/dL (ref 30.0–36.0)
MCHC: 35.1 g/dL (ref 30.0–36.0)
MCV: 81.4 fL (ref 78.0–100.0)
MCV: 87.1 fL (ref 78.0–100.0)
MCV: 82.3 fL (ref 78.0–100.0)
MCV: 82.8 fL (ref 78.0–100.0)
Platelets: 283 10*3/uL (ref 150–400)
Platelets: 265 10*3/uL (ref 150–400)
Platelets: 288 10*3/uL (ref 150–400)
Platelets: 300 10*3/uL (ref 150–400)
RBC: 4.24 MIL/uL (ref 3.87–5.11)
RBC: 4.02 MIL/uL (ref 3.87–5.11)
RBC: 4.3 MIL/uL (ref 3.87–5.11)
RBC: 4.4 MIL/uL (ref 3.87–5.11)
RDW: 13 % (ref 11.5–15.5)
RDW: 13.4 % (ref 11.5–15.5)
RDW: 12.8 % (ref 11.5–15.5)
RDW: 12.8 % (ref 11.5–15.5)
WBC: 6.3 10*3/uL (ref 4.0–10.5)
WBC: 4.2 10*3/uL (ref 4.0–10.5)
WBC: 4.8 10*3/uL (ref 4.0–10.5)
WBC: 6.3 10*3/uL (ref 4.0–10.5)

## 2010-08-25 LAB — DIFFERENTIAL
Basophils Absolute: 0 10*3/uL (ref 0.0–0.1)
Basophils Relative: 0 % (ref 0–1)
Eosinophils Absolute: 0.1 10*3/uL (ref 0.0–0.7)
Eosinophils Relative: 2 % (ref 0–5)
Lymphocytes Relative: 50 % — ABNORMAL HIGH (ref 12–46)
Lymphs Abs: 3.2 10*3/uL (ref 0.7–4.0)
Monocytes Absolute: 0.4 10*3/uL (ref 0.1–1.0)
Monocytes Relative: 7 % (ref 3–12)
Neutro Abs: 2.6 10*3/uL (ref 1.7–7.7)
Neutrophils Relative %: 42 % — ABNORMAL LOW (ref 43–77)

## 2010-08-25 LAB — URINALYSIS, ROUTINE W REFLEX MICROSCOPIC
Bilirubin Urine: NEGATIVE
Glucose, UA: NEGATIVE mg/dL
Hgb urine dipstick: NEGATIVE
Ketones, ur: NEGATIVE mg/dL
Nitrite: NEGATIVE
Protein, ur: NEGATIVE mg/dL
Specific Gravity, Urine: 1.013 (ref 1.005–1.030)
Urobilinogen, UA: 0.2 mg/dL (ref 0.0–1.0)
pH: 5.5 (ref 5.0–8.0)

## 2010-08-25 LAB — URINE MICROSCOPIC-ADD ON

## 2010-08-25 LAB — CARDIAC PANEL(CRET KIN+CKTOT+MB+TROPI)
CK, MB: 0.6 ng/mL (ref 0.3–4.0)
Relative Index: INVALID (ref 0.0–2.5)
Total CK: 86 U/L (ref 7–177)
Troponin I: 0.02 ng/mL (ref 0.00–0.06)

## 2010-08-25 LAB — ABO/RH: ABO/RH(D): B POS

## 2010-08-25 LAB — POCT CARDIAC MARKERS
CKMB, poc: <1 ng/mL — ABNORMAL LOW (ref 1.0–8.0)
Myoglobin, poc: 49.1 ng/mL (ref 12–200)
Troponin i, poc: <0.05 ng/mL (ref 0.00–0.09)

## 2010-08-25 LAB — APTT
aPTT: 43 seconds — ABNORMAL HIGH (ref 24–37)
aPTT: 45 seconds — ABNORMAL HIGH (ref 24–37)

## 2010-08-25 LAB — PROTIME-INR
INR: 1.91 — ABNORMAL HIGH (ref 0.00–1.49)
INR: 2 — ABNORMAL HIGH (ref 0.00–1.49)
INR: 2.17 — ABNORMAL HIGH (ref 0.00–1.49)
Prothrombin Time: 22 seconds — ABNORMAL HIGH (ref 11.6–15.2)
Prothrombin Time: 22.8 seconds — ABNORMAL HIGH (ref 11.6–15.2)
Prothrombin Time: 24.3 seconds — ABNORMAL HIGH (ref 11.6–15.2)

## 2010-08-25 LAB — HCG, QUANTITATIVE, PREGNANCY
hCG, Beta Chain, Quant, S: 198 m[IU]/mL — ABNORMAL HIGH (ref <5)
hCG, Beta Chain, Quant, S: 1086 m[IU]/mL — ABNORMAL HIGH (ref <5)

## 2010-08-25 LAB — D-DIMER, QUANTITATIVE: D-Dimer, Quant: <0.22 ug/mL-FEU (ref 0.00–0.48)

## 2010-08-25 LAB — POCT PREGNANCY, URINE: Preg Test, Ur: POSITIVE

## 2010-08-26 ENCOUNTER — Other Ambulatory Visit: Payer: Self-pay | Admitting: *Deleted

## 2010-08-26 DIAGNOSIS — I2699 Other pulmonary embolism without acute cor pulmonale: Secondary | ICD-10-CM

## 2010-08-26 DIAGNOSIS — Z7901 Long term (current) use of anticoagulants: Secondary | ICD-10-CM

## 2010-08-29 NOTE — Telephone Encounter (Signed)
Patient needs to come in for INR check with Dr. Alexandria Lodge prior to refill approval.  Her last INR was in Jul 25, 2010: 2.41.

## 2010-08-30 ENCOUNTER — Other Ambulatory Visit (INDEPENDENT_AMBULATORY_CARE_PROVIDER_SITE_OTHER): Payer: Self-pay

## 2010-08-30 DIAGNOSIS — Z7901 Long term (current) use of anticoagulants: Secondary | ICD-10-CM

## 2010-08-30 DIAGNOSIS — I2699 Other pulmonary embolism without acute cor pulmonale: Secondary | ICD-10-CM

## 2010-08-30 LAB — BETA-2-GLYCOPROTEIN I ABS, IGG/M/A
Beta-2 Glyco I IgG: 1 G Units (ref <20)
Beta-2-Glycoprotein I IgA: 5 A Units (ref <20)
Beta-2-Glycoprotein I IgM: 2 M Units (ref <20)

## 2010-08-30 LAB — DIFFERENTIAL
Basophils Absolute: 0 10*3/uL (ref 0.0–0.1)
Basophils Relative: 0 % (ref 0–1)
Eosinophils Absolute: 0.1 10*3/uL (ref 0.0–0.7)
Eosinophils Relative: 1 % (ref 0–5)
Lymphocytes Relative: 42 % (ref 12–46)
Lymphs Abs: 2.7 10*3/uL (ref 0.7–4.0)
Monocytes Absolute: 0.3 10*3/uL (ref 0.1–1.0)
Monocytes Relative: 5 % (ref 3–12)
Neutro Abs: 3.3 10*3/uL (ref 1.7–7.7)
Neutrophils Relative %: 51 % (ref 43–77)

## 2010-08-30 LAB — POCT I-STAT, CHEM 8
BUN: 3 mg/dL — ABNORMAL LOW (ref 6–23)
Calcium, Ion: 1.13 mmol/L (ref 1.12–1.32)
Chloride: 106 mEq/L (ref 96–112)
Creatinine, Ser: 0.7 mg/dL (ref 0.4–1.2)
Glucose, Bld: 103 mg/dL — ABNORMAL HIGH (ref 70–99)
HCT: 38 % (ref 36.0–46.0)
Hemoglobin: 12.9 g/dL (ref 12.0–15.0)
Potassium: 3.5 mEq/L (ref 3.5–5.1)
Sodium: 140 mEq/L (ref 135–145)
TCO2: 24 mmol/L (ref 0–100)

## 2010-08-30 LAB — URINE MICROSCOPIC-ADD ON

## 2010-08-30 LAB — CBC
HCT: 35 % — ABNORMAL LOW (ref 36.0–46.0)
HCT: 35.1 % — ABNORMAL LOW (ref 36.0–46.0)
Hemoglobin: 12.3 g/dL (ref 12.0–15.0)
Hemoglobin: 12.3 g/dL (ref 12.0–15.0)
MCHC: 34.9 g/dL (ref 30.0–36.0)
MCHC: 35.1 g/dL (ref 30.0–36.0)
MCV: 87.1 fL (ref 78.0–100.0)
MCV: 87.5 fL (ref 78.0–100.0)
Platelets: 185 10*3/uL (ref 150–400)
Platelets: 206 10*3/uL (ref 150–400)
RBC: 4.01 MIL/uL (ref 3.87–5.11)
RBC: 4.01 MIL/uL (ref 3.87–5.11)
RDW: 11.9 % (ref 11.5–15.5)
RDW: 12.4 % (ref 11.5–15.5)
WBC: 5 10*3/uL (ref 4.0–10.5)
WBC: 6.3 10*3/uL (ref 4.0–10.5)

## 2010-08-30 LAB — CARDIAC PANEL(CRET KIN+CKTOT+MB+TROPI)
CK, MB: 0.4 ng/mL (ref 0.3–4.0)
Relative Index: INVALID (ref 0.0–2.5)
Total CK: 64 U/L (ref 7–177)
Troponin I: <0.01 ng/mL (ref 0.00–0.06)

## 2010-08-30 LAB — PROTEIN S, TOTAL: Protein S Ag, Total: 76 % (ref 70–140)

## 2010-08-30 LAB — PROTIME-INR
INR: 1.01 (ref 0.00–1.49)
INR: 1.05 (ref 0.00–1.49)
Prothrombin Time: 13.2 seconds (ref 11.6–15.2)
Prothrombin Time: 13.6 seconds (ref 11.6–15.2)

## 2010-08-30 LAB — COMPREHENSIVE METABOLIC PANEL
ALT: 10 U/L (ref 0–35)
AST: 15 U/L (ref 0–37)
Albumin: 3.2 g/dL — ABNORMAL LOW (ref 3.5–5.2)
Alkaline Phosphatase: 43 U/L (ref 39–117)
BUN: 4 mg/dL — ABNORMAL LOW (ref 6–23)
CO2: 26 mEq/L (ref 19–32)
Calcium: 9.2 mg/dL (ref 8.4–10.5)
Chloride: 109 mEq/L (ref 96–112)
Creatinine, Ser: 0.7 mg/dL (ref 0.4–1.2)
GFR calc Af Amer: >60 mL/min (ref >60)
GFR calc non Af Amer: >60 mL/min (ref >60)
Glucose, Bld: 105 mg/dL — ABNORMAL HIGH (ref 70–99)
Potassium: 3.3 mEq/L — ABNORMAL LOW (ref 3.5–5.1)
Sodium: 142 mEq/L (ref 135–145)
Total Bilirubin: 0.5 mg/dL (ref 0.3–1.2)
Total Protein: 7.1 g/dL (ref 6.0–8.3)

## 2010-08-30 LAB — PROTEIN S ACTIVITY: Protein S Activity: 68 % — ABNORMAL LOW (ref 69–129)

## 2010-08-30 LAB — HOMOCYSTEINE: Homocysteine: 7 umol/L (ref 4.0–15.4)

## 2010-08-30 LAB — D-DIMER, QUANTITATIVE: D-Dimer, Quant: 3.36 ug/mL-FEU — ABNORMAL HIGH (ref 0.00–0.48)

## 2010-08-30 LAB — CARDIOLIPIN ANTIBODIES, IGG, IGM, IGA
Anticardiolipin IgA: 4 APL U/mL — ABNORMAL LOW (ref <22)
Anticardiolipin IgG: 7 GPL U/mL — ABNORMAL LOW (ref <23)
Anticardiolipin IgM: 7 MPL U/mL — ABNORMAL LOW (ref <11)

## 2010-08-30 LAB — URINALYSIS, ROUTINE W REFLEX MICROSCOPIC
Bilirubin Urine: NEGATIVE
Glucose, UA: NEGATIVE mg/dL
Hgb urine dipstick: NEGATIVE
Ketones, ur: NEGATIVE mg/dL
Nitrite: NEGATIVE
Protein, ur: NEGATIVE mg/dL
Specific Gravity, Urine: 1.016 (ref 1.005–1.030)
Urobilinogen, UA: 0.2 mg/dL (ref 0.0–1.0)
pH: 5.5 (ref 5.0–8.0)

## 2010-08-30 LAB — LUPUS ANTICOAGULANT PANEL
DRVVT: 49.2 secs — ABNORMAL HIGH (ref 36.2–44.3)
Lupus Anticoagulant: NOT DETECTED
PTT Lupus Anticoagulant: 47.9 secs — ABNORMAL HIGH (ref 32.0–43.4)
PTTLA 4:1 Mix: 44.5 secs (ref 36.3–48.8)
PTTLA Confirmation: 6.3 secs (ref <8.0)
dRVVT Incubated 1:1 Mix: 43 secs (ref 36.2–44.3)

## 2010-08-30 LAB — PROTEIN C, TOTAL: Protein C, Total: 92 % (ref 70–140)

## 2010-08-30 LAB — PROTHROMBIN GENE MUTATION

## 2010-08-30 LAB — POCT PREGNANCY, URINE: Preg Test, Ur: NEGATIVE

## 2010-08-30 LAB — ANTITHROMBIN III: AntiThromb III Func: 81 % (ref 76–126)

## 2010-08-30 LAB — FACTOR 5 LEIDEN

## 2010-08-30 LAB — POCT INR: INR: 1.4

## 2010-08-30 LAB — PROTEIN C ACTIVITY: Protein C Activity: 126 % (ref 75–133)

## 2010-08-30 MED ORDER — WARFARIN SODIUM 5 MG PO TABS: ORAL_TABLET | ORAL | Status: DC

## 2010-09-29 ENCOUNTER — Encounter: Payer: Self-pay | Admitting: Internal Medicine

## 2010-09-29 ENCOUNTER — Ambulatory Visit (INDEPENDENT_AMBULATORY_CARE_PROVIDER_SITE_OTHER): Payer: Self-pay | Admitting: Internal Medicine

## 2010-09-29 VITALS — BP 122/81 | HR 73 | Temp 97.6°F | Wt 168.1 lb

## 2010-09-29 DIAGNOSIS — Z7901 Long term (current) use of anticoagulants: Secondary | ICD-10-CM

## 2010-09-29 DIAGNOSIS — I2699 Other pulmonary embolism without acute cor pulmonale: Secondary | ICD-10-CM

## 2010-09-29 LAB — POCT INR: INR: 4.5

## 2010-09-29 NOTE — Progress Notes (Signed)
  Subjective:    Patient ID: Susan Austin, female    DOB: 1985/11/30, 25 y.o.   MRN: [id number]  HPI The patient is a 25 year old female who is here today for complaints of abdominal pain. This is my first time seeing this patient. Patient states that she started taking over-the-counter multivitamin tablets about 10 days ago and noticed abdominal pain. Pain was located in the middle of her belly, nonradiating, nonspecific, 5-6/10 at its worse, not associated with change in bowel habits or change in urinary symptoms. No fever or chills noted. Patient does not have shortness of breath or chest pain. Patient states that the abdominal pain completely resolved by itself when she stopped taking the multivitamins 3 days ago.  Patient was started on chronic Coumadin therapy for an episode of pulmonary embolism in April 2011. Patient missed her last appointment with Coumadin clinic and her INR today is 4.3. Patient is also due for reconsideration of Coumadin therapy next month.  Patient is to for a Pap smear and a tetanus shot but she denies today.  Patient asks me whether she can get a tattoo done, I advised her to wait until she gets off Coumadin completely.   Review of Systems  Constitutional: Negative for fever, activity change and appetite change.  HENT: Negative for sore throat.   Respiratory: Negative for cough and shortness of breath.   Cardiovascular: Negative for chest pain and leg swelling.  Gastrointestinal: Negative for nausea, abdominal pain, diarrhea, constipation and abdominal distention.  Genitourinary: Negative for frequency, hematuria and difficulty urinating.  Neurological: Negative for dizziness and headaches.  Psychiatric/Behavioral: Negative for suicidal ideas and behavioral problems.       Objective:   Physical Exam  Constitutional: She is oriented to person, place, and time. She appears well-developed and well-nourished.  HENT:  Head: Normocephalic and atraumatic.    Eyes: Conjunctivae and EOM are normal. Pupils are equal, round, and reactive to light. No scleral icterus.  Neck: Normal range of motion. Neck supple. No JVD present. No thyromegaly present.  Cardiovascular: Normal rate, regular rhythm, normal heart sounds and intact distal pulses.  Exam reveals no gallop and no friction rub.   No murmur heard. Pulmonary/Chest: Effort normal and breath sounds normal. No respiratory distress. She has no wheezes. She has no rales.  Abdominal: Soft. Bowel sounds are normal. She exhibits no distension and no mass. There is no tenderness. There is no rebound and no guarding.  Musculoskeletal: Normal range of motion. She exhibits no edema and no tenderness.  Lymphadenopathy:    She has no cervical adenopathy.  Neurological: She is alert and oriented to person, place, and time.  Psychiatric: She has a normal mood and affect. Her behavior is normal.          Assessment & Plan:

## 2010-09-29 NOTE — Patient Instructions (Signed)
Pulmonary Embolus (PE) A pulmonary (lung) embolus (PE) is a blood clot that has traveled from another place in the body to the lung. Most clots come from deep veins in the legs or pelvis. PE is a dangerous and potentially life-threatening condition that can be treated if identified. CAUSES Blood clots form in a vein for different reasons. Usually several things cause blood clots. They include:  The flow of blood slows down.   The inside of the vein is damaged in some way.   The person has a condition that makes the blood clot more easily. These conditions may include:   Older age (especially over 102 years old).   Having a history of blood clots.   Having major or lengthy surgery. Hip surgery is particularly high-risk.   Breaking a hip or leg.   Sitting or lying still for a long time.   Cancer or cancer treatment.   Having a long, thin tube (catheter) placed inside a vein during a medical procedure.   Being overweight (obese).   Pregnancy and childbirth.   Medicines with estrogen.   Smoking.   Other circulation or heart problems.  SYMPTOMS The symptoms of a PE usually start suddenly and include:  Shortness of breath.   Coughing.   Coughing up blood or blood-tinged mucus (phlegm).   Chest pain. Pain is often worse with deep breaths.   Rapid heartbeat.  DIAGNOSIS  If a PE is suspected, your caregiver will take a medical history and carry out a physical exam. Your caregiver will check for the risk factors listed above. Tests that also may be required include:  Blood tests, including studies of the clotting properties of your blood.   Imaging tests. Ultrasound, CT, MRI, and other tests can all be used to see if you have clots in your legs or lungs. If you have a clot in your legs and have breathing or chest problems, your caregiver may conclude that you have a clot in your lungs. Further lung tests may not be needed.   An EKG can look for heart strain from blood clots  in the lungs.  PREVENTION  Exercise the legs regularly. Take a brisk 30 minute walk every day.   Maintain a weight that is appropriate for your height.   Avoid sitting or lying in bed for long periods of time without moving your legs.   Women, particularly those over the age of 19, should consider the risks and benefits of taking estrogen medicines, including birth control pills.   Do not smoke, especially if you take estrogen medicines.   Long-distance travel can increase your risk. You should exercise your legs by walking or pumping the muscles every hour.   In hospital prevention:   Your caregiver will assess your need for preventive PE care (prophylaxis) when you are admitted to the hospital. If you are having surgery, your surgeon will assess you the day of or day after surgery.   Prevention may include medical and nonmedical measures.  TREATMENT  The most common treatment for a PE is blood thinning (anticoagulant) medicine, which reduces the blood's tendency to clot. Anticoagulants can stop new blood clots from forming and old ones from growing. They cannot dissolve existing clots. Your body does this by itself over time. Anticoagulants can be given by mouth, by intravenous (IV) access, or by injection. Your caregiver will determine the best program for you.   Less commonly, clot-dissolving drugs (thrombolytics) are used to dissolve a PE. They carry a  high risk of bleeding, so they are used mainly in severe cases.   Very rarely, a blood clot in the leg needs to be removed surgically.   If you are unable to take anticoagulants, your caregiver may arrange for you to have a filter placed in a main vein in your belly (abdomen). This filter prevents clots from traveling to your lungs.  HOME CARE INSTRUCTIONS  Take all medicines prescribed by your caregiver. Follow the directions carefully.   You will most likely continue taking anticoagulants after you leave the hospital. Your  caregiver will advise you on the length of treatment (usually 3 to 6 months, sometimes for life).   Taking too much or too little of an anticoagulant is dangerous. While taking this type of medicine, you will need to have regular blood tests to be sure the dose is correct. The dose can change for many reasons. It is critically important that you take this medicine exactly as prescribed and that you have blood tests exactly as directed.   Many foods can interfere with anticoagulants. These include foods high in vitamin K, such as spinach, kale, broccoli, cabbage, collard and turnip greens, Brussels sprouts, peas, cauliflower, seaweed, parsley, beef and pork liver, green tea, and soybean oil. Your caregiver should discuss limits on these foods with you or you should arrange a visit with a dietician to answer your questions.   Many medicines can interfere with anticoagulants. You must tell your caregiver about any and all medicines you take. This includes all vitamins and supplements. Be especially cautious with aspirin and anti-inflammatory medicines. Ask your caregiver before taking these.   Anticoagulants can have side effects, mostly excessive bruising or bleeding. You will need to hold pressure over cuts for longer than usual. Avoid alcoholic drinks or consume only very small amounts while taking this medicine.   If you are taking an anticoagulant:   Wear a medical alert bracelet.   Notify your dentist or other caregivers before procedures.   Avoid contact sports.   Ask your caregiver how soon you can go back to normal activities. Not being active can lead to new clots. Ask for a list of what you should and should not do.   Exercise your lower leg muscles. This is important while traveling.   You may need to wear compression stockings. These are tight elastic stockings that apply pressure to the lower legs. This can help keep the blood in the legs from clotting.   If you are a smoker, you  should quit.   Learn as much as you can about pulmonary embolisms.  SEEK MEDICAL CARE IF:  You notice a rapid heartbeat.   You feel weaker or more tired than usual.   You feel faint.   You notice increased bruising.   Your symptoms are not getting better in the time expected.   You are having side effects of medicine.   You have an oral temperature above 101F.   You discover other family members with blood clots. This may require further testing for inherited diseases or conditions.  SEEK IMMEDIATE MEDICAL CARE IF:  You have chest pain.   You have trouble breathing.   You have new or increased swelling or pain in one leg.   You cough up blood.   You notice blood in vomit, in a bowel movement, or in urine.   You have an oral temperature above 101F, not controlled by medicine.  You may have another PE. A blood clot in  the lungs is a medical emergency. Call your local emergency services [phone] (911 in [place] to get to the nearest hospital or clinic. Do not drive yourself. MAKE SURE YOU:  Understand these instructions.   Will watch your condition.   Will get help right away if you are not doing well or get worse.  Document Released: 05/26/2000 Document Re-Released: 08/23/2009 [facility] Patient Information 2011 [place], [place].   Set up appointment with you PCP next month to get off blood thinners. You are due for pap smear and tetanus shot next visit.

## 2010-09-29 NOTE — Assessment & Plan Note (Signed)
It has been one year that patient has been on Coumadin therapy. Given that patient does not have continued risk factors I think that patient should be off Coumadin therapy. This should be considered at her next office visit in May with her primary care physician.

## 2010-09-29 NOTE — Assessment & Plan Note (Signed)
Patient's INR today is supratherapeutic at 4.3. I have advised her to skip 2 days of warfarin and then resume her Coumadin as prescribed. I have also asked the patient to set up an appointment at Coumadin clinic sometime next week.

## 2010-10-04 ENCOUNTER — Telehealth: Payer: Self-pay | Admitting: Pharmacist

## 2010-10-04 NOTE — Telephone Encounter (Signed)
Discussed with Attending Physician providing oversight for anticoagulation clinic for today. 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 Guidelines permit discontinuation of warfarin. Discussed signs and symptoms that could indicate recurrence of VTE with patient and she verbalized understanding of these. Patient was advised to discontinue warfarin therapy. She is accepting of this.

## 2010-10-04 NOTE — Telephone Encounter (Signed)
Agree with the above note. I think patient has already completed her 1 year warfarin therapy and all the risk factors that existed a year ago have been taken off at this time. It is very reasonable to take her off coumadin.

## 2011-08-01 ENCOUNTER — Encounter (HOSPITAL_COMMUNITY): Payer: Self-pay

## 2011-08-01 ENCOUNTER — Emergency Department (HOSPITAL_COMMUNITY)
Admission: EM | Admit: 2011-08-01 | Discharge: 2011-08-01 | Disposition: A | Discharge status: Home / Self Care | Payer: Self-pay | Attending: Emergency Medicine | Admitting: Emergency Medicine

## 2011-08-01 DIAGNOSIS — K2990 Gastroduodenitis, unspecified, without bleeding: Secondary | ICD-10-CM | POA: Insufficient documentation

## 2011-08-01 DIAGNOSIS — R112 Nausea with vomiting, unspecified: Secondary | ICD-10-CM | POA: Insufficient documentation

## 2011-08-01 DIAGNOSIS — R1110 Vomiting, unspecified: Secondary | ICD-10-CM

## 2011-08-01 DIAGNOSIS — K2970 Gastritis, unspecified, without bleeding: Secondary | ICD-10-CM | POA: Insufficient documentation

## 2011-08-01 DIAGNOSIS — R109 Unspecified abdominal pain: Secondary | ICD-10-CM | POA: Insufficient documentation

## 2011-08-01 LAB — LIPASE, BLOOD: Lipase: 34 U/L (ref 11–59)

## 2011-08-01 LAB — DIFFERENTIAL
Basophils Absolute: 0 10*3/uL (ref 0.0–0.1)
Basophils Relative: 0 % (ref 0–1)
Eosinophils Absolute: 0 10*3/uL (ref 0.0–0.7)
Eosinophils Relative: 0 % (ref 0–5)
Lymphocytes Relative: 14 % (ref 12–46)
Lymphs Abs: 0.7 10*3/uL (ref 0.7–4.0)
Monocytes Absolute: 0.3 10*3/uL (ref 0.1–1.0)
Monocytes Relative: 5 % (ref 3–12)
Neutro Abs: 4.1 10*3/uL (ref 1.7–7.7)
Neutrophils Relative %: 81 % — ABNORMAL HIGH (ref 43–77)

## 2011-08-01 LAB — URINALYSIS, ROUTINE W REFLEX MICROSCOPIC
Bilirubin Urine: NEGATIVE
Glucose, UA: NEGATIVE mg/dL
Hgb urine dipstick: NEGATIVE
Ketones, ur: NEGATIVE mg/dL
Leukocytes, UA: NEGATIVE
Nitrite: NEGATIVE
Protein, ur: NEGATIVE mg/dL
Specific Gravity, Urine: 1.015 (ref 1.005–1.030)
Urobilinogen, UA: 1 mg/dL (ref 0.0–1.0)
pH: 7 (ref 5.0–8.0)

## 2011-08-01 LAB — COMPREHENSIVE METABOLIC PANEL
ALT: 18 U/L (ref 0–35)
AST: 42 U/L — ABNORMAL HIGH (ref 0–37)
Albumin: 3.7 g/dL (ref 3.5–5.2)
Alkaline Phosphatase: 40 U/L (ref 39–117)
BUN: 8 mg/dL (ref 6–23)
CO2: 23 mEq/L (ref 19–32)
Calcium: 9.2 mg/dL (ref 8.4–10.5)
Chloride: 104 mEq/L (ref 96–112)
Creatinine, Ser: 0.56 mg/dL (ref 0.50–1.10)
GFR calc Af Amer: >90 mL/min (ref >90)
GFR calc non Af Amer: >90 mL/min (ref >90)
Glucose, Bld: 90 mg/dL (ref 70–99)
Potassium: 5.2 mEq/L — ABNORMAL HIGH (ref 3.5–5.1)
Sodium: 136 mEq/L (ref 135–145)
Total Bilirubin: 0.7 mg/dL (ref 0.3–1.2)
Total Protein: 7.9 g/dL (ref 6.0–8.3)

## 2011-08-01 LAB — CBC
HCT: 39.1 % (ref 36.0–46.0)
Hemoglobin: 12.9 g/dL (ref 12.0–15.0)
MCH: 27.7 pg (ref 26.0–34.0)
MCHC: 33 g/dL (ref 30.0–36.0)
MCV: 84.1 fL (ref 78.0–100.0)
Platelets: 266 10*3/uL (ref 150–400)
RBC: 4.65 MIL/uL (ref 3.87–5.11)
RDW: 12.7 % (ref 11.5–15.5)
WBC: 5.1 10*3/uL (ref 4.0–10.5)

## 2011-08-01 LAB — PREGNANCY, URINE: Preg Test, Ur: NEGATIVE

## 2011-08-01 MED ORDER — ONDANSETRON 4 MG PO TBDP
8.0000 mg | ORAL_TABLET | Freq: Once | ORAL | Status: AC
  Administered 2011-08-01: 8 mg via ORAL
  Filled 2011-08-01: qty 2

## 2011-08-01 MED ORDER — RANITIDINE HCL 150 MG PO CAPS: 150.0000 mg | ORAL_CAPSULE | Freq: Every day | ORAL | Status: DC

## 2011-08-01 MED ORDER — RANITIDINE HCL 150 MG/10ML PO SYRP
150.0000 mg | ORAL_SOLUTION | ORAL | Status: AC
  Administered 2011-08-01: 150 mg via ORAL
  Filled 2011-08-01: qty 10

## 2011-08-01 MED ORDER — SODIUM CHLORIDE 0.9 % IV BOLUS (SEPSIS)
1000.0000 mL | Freq: Once | INTRAVENOUS | Status: AC
  Administered 2011-08-01: 1000 mL via INTRAVENOUS

## 2011-08-01 MED ORDER — ONDANSETRON HCL 4 MG PO TABS: 4.0000 mg | ORAL_TABLET | Freq: Four times a day (QID) | ORAL | Status: AC

## 2011-08-01 NOTE — ED Notes (Signed)
Abdominal pain with n/v.  Pt. Reports vomited X6 since last night

## 2011-08-01 NOTE — Discharge Instructions (Signed)
Ms. Fallas we gave you IV fluids and nausea medicine in the ER today.  Start BRAT Diet slowly today. Get some Zantac over-the-counter 150 mg and take this prior to eating. The zofran is for nausea. Return for severe pain or uncontrolled nausea and vomiting.    Gastritis Gastritis is an irritation of the stomach. It can be caused by anything that bothers the stomach. Some irritants include:  Alcohol.   Caffeine.   Nicotine.   Spicy, acidic, greasy, and fried foods.   Medicines for pain and arthritis.   Emotional distress.  HOME CARE   Only take medicine as told by your doctor.   Take small sips of clear liquids often. Do not drink large amounts of liquids at one time.   If you have watery poop (diarrhea), avoid milk, fruit, tobacco, alcohol, really hot or cold liquids, or too much of anything at one time.   Rest.   Wash your hands often.   Once you can keep clear liquids down, start soups, juices, apple sauce, crackers, and sherbet. Slowly add plain, not spicy, foods to your diet.  GET HELP RIGHT AWAY IF:   You cannot keep fluids down.   You cannot stop throwing up (vomiting) or you throw up blood.   You have more stomach or chest pain.   You have a temperature by mouth above 102 F (38.9 C), not controlled by medicine.   You pass out (faint), feel lightheaded, or are more thirsty than normal.   You have bloody or black poop (stools).   Your watery poop will not go away.   You are not improving or are getting worse.  MAKE SURE YOU:   Understand these instructions.   Will watch your condition.   Will get help right away if you are not doing well or get worse.  Document Released: 11/15/2007 Document Revised: 02/08/2011 Document Reviewed: 04/16/2009 [facility] Patient Information 2012 [place], [place].

## 2011-08-22 NOTE — ED Provider Notes (Cosign Needed)
History     CSN: [id number]  Arrival date & time 08/01/11  4098   First MD Initiated Contact with Patient 08/01/11 [phone]      Chief Complaint  Patient presents with  . Nausea    (Consider location/radiation/quality/duration/timing/severity/associated sxs/prior treatment) Patient is a 26 y.o. female presenting with cramps. The history is provided by the patient. No language interpreter was used.  Abdominal Cramping The primary symptoms of the illness include fatigue, nausea and vomiting. The primary symptoms of the illness do not include fever, shortness of breath, diarrhea, hematemesis, hematochezia, dysuria, vaginal discharge or vaginal bleeding. The current episode started yesterday. The onset of the illness was gradual. The problem has not changed since onset. The patient states that she believes she is currently not pregnant. The patient has had a change in bowel habit. Additional symptoms associated with the illness include diaphoresis. Symptoms associated with the illness do not include chills, anorexia, heartburn, constipation, urgency, hematuria or frequency. Significant associated medical issues do not include PUD, GERD, diabetes, sickle cell disease, gallstones, substance abuse, diverticulitis or HIV.  Reports nausea and vomiting x 6 through the night. Lower abdominal cramping with generalized abdominal pain.  + sick contacts.    History reviewed. No pertinent past medical history.  History reviewed. No pertinent past surgical history.  No family history on file.  History  Substance Use Topics  . Smoking status: Never Smoker   . Smokeless tobacco: Not on file  . Alcohol Use: No    OB History    Grav Para Term Preterm Abortions TAB SAB Ect Mult Living                  Review of Systems  Constitutional: Positive for diaphoresis and fatigue. Negative for fever and chills.  Respiratory: Negative for shortness of breath.   Gastrointestinal: Positive for nausea and  vomiting. Negative for heartburn, diarrhea, constipation, hematochezia, anorexia and hematemesis.  Genitourinary: Negative for dysuria, urgency, frequency, hematuria, vaginal bleeding and vaginal discharge.  All other systems reviewed and are negative.    Allergies  Review of patient's allergies indicates no known allergies.  Home Medications   Current Outpatient Rx  Name Route Sig Dispense Refill  . IBUPROFEN 200 MG PO TABS Oral Take 200 mg by mouth every 8 (eight) hours as needed. For pain    . RANITIDINE HCL 150 MG PO CAPS Oral Take 1 capsule (150 mg total) by mouth daily. 30 capsule 0    BP 111/72  Pulse 84  Temp(Src) 98.2 F (36.8 C) (Oral)  Resp 16  Ht 5\' 3"  (1.6 m)  Wt 170 lb (77.111 kg)  BMI 30.11 kg/m2  SpO2 100%  LMP 07/11/2011  Physical Exam  Nursing note and vitals reviewed. Constitutional: She is oriented to person, place, and time. She appears well-developed and well-nourished.  HENT:  Head: Normocephalic and atraumatic.  Eyes: Conjunctivae and EOM are normal. Pupils are equal, round, and reactive to light.  Neck: Normal range of motion. Neck supple.  Cardiovascular: Normal rate.   Pulmonary/Chest: Effort normal and breath sounds normal.  Abdominal: Soft. Bowel sounds are normal. There is tenderness.       generized abdominal pain with lower cramping  Musculoskeletal: Normal range of motion. She exhibits no edema and no tenderness.  Neurological: She is alert and oriented to person, place, and time. She has normal reflexes.  Skin: Skin is warm and dry.  Psychiatric: She has a normal mood and affect.    ED Course  Procedures (including critical care time)  Labs Reviewed  DIFFERENTIAL - Abnormal; Notable for the following:    Neutrophils Relative 81 (*)    All other components within normal limits  COMPREHENSIVE METABOLIC PANEL - Abnormal; Notable for the following:    Potassium 5.2 (*) HEMOLYSIS AT THIS LEVEL MAY AFFECT RESULT   AST 42 (*) HEMOLYSIS  AT THIS LEVEL MAY AFFECT RESULT   All other components within normal limits  CBC  URINALYSIS, ROUTINE W REFLEX MICROSCOPIC  PREGNANCY, URINE  LIPASE, BLOOD  LAB REPORT - SCANNED   No results found.   1. Gastritis   2. Vomiting       MDM   26yo n& v better after IV fluids, zofran and zantac/pepcid in the ER.  Labs unremarkable.  Feels better.  Will return if worse No pcp. k hemolized at 5.2.   Labs Reviewed  DIFFERENTIAL - Abnormal; Notable for the following:    Neutrophils Relative 81 (*)    All other components within normal limits  COMPREHENSIVE METABOLIC PANEL - Abnormal; Notable for the following:    Potassium 5.2 (*) HEMOLYSIS AT THIS LEVEL MAY AFFECT RESULT   AST 42 (*) HEMOLYSIS AT THIS LEVEL MAY AFFECT RESULT   All other components within normal limits  CBC  URINALYSIS, ROUTINE W REFLEX MICROSCOPIC  PREGNANCY, URINE  LIPASE, BLOOD  LAB REPORT - SCANNED         Jethro Bastos, NP 08/22/11 1501

## 2012-08-17 ENCOUNTER — Encounter (HOSPITAL_COMMUNITY): Payer: Self-pay | Admitting: *Deleted

## 2012-08-17 ENCOUNTER — Inpatient Hospital Stay (HOSPITAL_COMMUNITY): Payer: No Typology Code available for payment source

## 2012-08-17 ENCOUNTER — Inpatient Hospital Stay (HOSPITAL_COMMUNITY)
Admission: AD | Admit: 2012-08-17 | Discharge: 2012-08-17 | Disposition: A | Discharge status: Home / Self Care | Payer: No Typology Code available for payment source | Source: Ambulatory Visit | Attending: Obstetrics and Gynecology | Admitting: Obstetrics and Gynecology

## 2012-08-17 DIAGNOSIS — R079 Chest pain, unspecified: Secondary | ICD-10-CM | POA: Insufficient documentation

## 2012-08-17 DIAGNOSIS — A499 Bacterial infection, unspecified: Secondary | ICD-10-CM | POA: Insufficient documentation

## 2012-08-17 DIAGNOSIS — O99891 Other specified diseases and conditions complicating pregnancy: Secondary | ICD-10-CM | POA: Insufficient documentation

## 2012-08-17 DIAGNOSIS — W010XXA Fall on same level from slipping, tripping and stumbling without subsequent striking against object, initial encounter: Secondary | ICD-10-CM | POA: Insufficient documentation

## 2012-08-17 DIAGNOSIS — N760 Acute vaginitis: Secondary | ICD-10-CM | POA: Insufficient documentation

## 2012-08-17 DIAGNOSIS — R1084 Generalized abdominal pain: Secondary | ICD-10-CM

## 2012-08-17 DIAGNOSIS — B9689 Other specified bacterial agents as the cause of diseases classified elsewhere: Secondary | ICD-10-CM | POA: Insufficient documentation

## 2012-08-17 DIAGNOSIS — R109 Unspecified abdominal pain: Secondary | ICD-10-CM | POA: Insufficient documentation

## 2012-08-17 DIAGNOSIS — O26899 Other specified pregnancy related conditions, unspecified trimester: Secondary | ICD-10-CM

## 2012-08-17 DIAGNOSIS — Y9329 Activity, other involving ice and snow: Secondary | ICD-10-CM | POA: Insufficient documentation

## 2012-08-17 DIAGNOSIS — O9A211 Injury, poisoning and certain other consequences of external causes complicating pregnancy, first trimester: Secondary | ICD-10-CM

## 2012-08-17 DIAGNOSIS — O2390 Unspecified genitourinary tract infection in pregnancy, unspecified trimester: Secondary | ICD-10-CM | POA: Insufficient documentation

## 2012-08-17 DIAGNOSIS — Z86711 Personal history of pulmonary embolism: Secondary | ICD-10-CM | POA: Insufficient documentation

## 2012-08-17 HISTORY — DX: Other pulmonary embolism without acute cor pulmonale: I26.99

## 2012-08-17 LAB — URINALYSIS, ROUTINE W REFLEX MICROSCOPIC
Bilirubin Urine: NEGATIVE
Glucose, UA: NEGATIVE mg/dL
Hgb urine dipstick: NEGATIVE
Ketones, ur: NEGATIVE mg/dL
Nitrite: NEGATIVE
Protein, ur: NEGATIVE mg/dL
Specific Gravity, Urine: 1.02 (ref 1.005–1.030)
Urobilinogen, UA: 0.2 mg/dL (ref 0.0–1.0)
pH: 5.5 (ref 5.0–8.0)

## 2012-08-17 LAB — WET PREP, GENITAL
Trich, Wet Prep: NONE SEEN
Yeast Wet Prep HPF POC: NONE SEEN

## 2012-08-17 LAB — URINE MICROSCOPIC-ADD ON

## 2012-08-17 LAB — HCG, QUANTITATIVE, PREGNANCY: hCG, Beta Chain, Quant, S: 10021 m[IU]/mL — ABNORMAL HIGH (ref <5)

## 2012-08-17 LAB — POCT PREGNANCY, URINE: Preg Test, Ur: POSITIVE — AB

## 2012-08-17 MED ORDER — ENOXAPARIN SODIUM 40 MG/0.4ML ~~LOC~~ SOLN
40.0000 mg | Freq: Once | SUBCUTANEOUS | Status: AC
  Administered 2012-08-17: 40 mg via SUBCUTANEOUS
  Filled 2012-08-17: qty 0.4

## 2012-08-17 MED ORDER — METRONIDAZOLE 500 MG PO TABS: 500.0000 mg | ORAL_TABLET | Freq: Two times a day (BID) | ORAL | Status: DC

## 2012-08-17 MED ORDER — CONCEPT OB 130-92.4-1 MG PO CAPS: 1.0000 | ORAL_CAPSULE | Freq: Every day | ORAL | Status: DC

## 2012-08-17 MED ORDER — ENOXAPARIN SODIUM 40 MG/0.4ML ~~LOC~~ SOLN: 40.0000 mg | Freq: Every day | SUBCUTANEOUS | Status: DC

## 2012-08-17 NOTE — MAU Provider Note (Signed)
History     CSN: [id number]  Arrival date and time: 08/17/12 1825   First Provider Initiated Contact with Patient 08/17/12 1912      Chief Complaint  Patient presents with  . Fall   HPI Pt is [id number] [redacted]w[redacted]d pregnant.  Her last pregnancy was SAB 1 year ago .  She had a PE  And DVT.  Pt fell on the ice this morning and hit her right hip.  Her chest started hurting like she had when she had her DVT. Pt is having some abdominal cramping.  She denies spotting or bleeding or abdnormal vaginal discharge. Pt denies constipation or diarrhea.  Pt went off anticoagulant about 1 year ago.  She was using condoms for contraception.  Past Medical History  Diagnosis Date  . Pulmonary emboli     Past Surgical History  Procedure Laterality Date  . Cesarean section      Family History  Problem Relation Age of Onset  . Alcohol abuse Neg Hx   . Arthritis Neg Hx   . Asthma Neg Hx   . Birth defects Neg Hx   . Cancer Neg Hx   . COPD Neg Hx   . Depression Neg Hx   . Diabetes Neg Hx   . Drug abuse Neg Hx   . Early death Neg Hx   . Hearing loss Neg Hx   . Heart disease Neg Hx   . Hyperlipidemia Neg Hx   . Hypertension Neg Hx   . Kidney disease Neg Hx   . Learning disabilities Neg Hx   . Mental illness Neg Hx   . Mental retardation Neg Hx   . Miscarriages / Stillbirths Neg Hx   . Stroke Neg Hx   . Vision loss Neg Hx     History  Substance Use Topics  . Smoking status: Never Smoker   . Smokeless tobacco: Not on file  . Alcohol Use: No    Allergies: No Known Allergies  No prescriptions prior to admission    Review of Systems  Constitutional: Negative for fever and chills.  Cardiovascular: Positive for chest pain. Negative for palpitations.  Gastrointestinal: Positive for abdominal pain. Negative for diarrhea and constipation.  Genitourinary: Negative for dysuria and urgency.   Physical Exam   Blood pressure 120/68, pulse 82, resp. rate 18, height 5\' 3"  (1.6 m), weight 191 lb  9.6 oz (86.909 kg), last menstrual period 07/01/2012, SpO2 100.00%. Patient Vitals for the past 24 hrs:  BP Temp src Pulse Resp SpO2 Height Weight  08/17/12 2234 107/83 mmHg Oral 75 18 100 % - -  08/17/12 1837 120/68 mmHg Oral 82 18 100 % 5\' 3"  (1.6 m) 86.909 kg (191 lb 9.6 oz)     Physical Exam  Nursing note and vitals reviewed. Constitutional: She appears well-developed and well-nourished. No distress.  HENT:  Head: Normocephalic.  Eyes: Pupils are equal, round, and reactive to light.  Neck: Normal range of motion. Neck supple.  Cardiovascular: Normal rate, regular rhythm and normal heart sounds.   Respiratory: Effort normal and breath sounds normal. No respiratory distress.  SpO2 100%; pt feels some discomfort like pressure with inspiration  GI: Soft. Bowel sounds are normal. She exhibits no distension. There is no tenderness. There is no rebound and no guarding.  Musculoskeletal: Normal range of motion.  Neurological: She is alert.  Skin: Skin is warm and dry.  Psychiatric: She has a normal mood and affect.  discussed with Dr. Jolayne Panther- will send to [facility]  for work up for DVT after viability of pregnancy proven Care handed over to [place] MAU Course  Procedures  LINEBERRY,SUSAN 08/17/2012, 7:12 PM   Assumed care at patient at 2045. Pt in ultrasound. 2115: US shows viable SIUP, S=D. Pt states she does not want to be transferred to [facility] for PE work-up due to concerns about billing. Lengthy conversation about high risk for PE due to Hx of PE, pregnancy state. Strongly urged pt to receive complete eval. Pt would like to be discharged for MAU and go to [facility] ED in the morning due to childcare issues. Strongly urged her to resolve childcare issues and go directly to [facility]. Discussed that CNM is already very uncomfortable considering allowing her to go in private vehicle and that she would D/C'd AMA if she did not intend to go directly to [facility]. Dr. Jolayne Panther notified of pt situation.  Agrees that if pt appears stable she is OK to go by private vehicle. Will give Lovenox 40 mg (prophylactic dose) in MAU and Rx.   2200: Pt found childcare. Discussed pt situations w/ Dr. Karin Golden at [facility]. Lovenox given. Minimal chest pain. No SOB at time of D/C.  Asessment: 1. Traumatic injury during pregnancy, first trimester   2. Hx pulmonary embolism   3. Chest pain   4. Pregnancy with generalized abdominal pain, antepartum   5. BV (bacterial vaginosis)    Plan: D/C to go in private vehicle to [facility] ED. Follow-up Information   Follow up with [facility]. (immedaitely after discharge from Maternity Admissions)    Contact information:   [address] [place] [address]       Follow up with [facility]. (will call you to schedule appointment)    Contact information:   [address] [place] [address] [phone]      Follow up with [facility]ERN[facility]. (As needed for pregnancy related emergencies)    Contact information:   [address]  [place] [address] [phone]       Medication List    TAKE these medications       CONCEPT OB 130-92.4-1 MG Caps  Take 1 tablet by mouth daily.     enoxaparin 40 MG/0.4ML injection  Commonly known as:  LOVENOX  Inject 0.4 mLs (40 mg total) into the skin daily.     metroNIDAZOLE 500 MG tablet  Commonly known as:  FLAGYL  Take 1 tablet (500 mg total) by mouth 2 (two) times daily.       [place], CNM 08/17/2012 11:51 PM

## 2012-08-17 NOTE — MAU Note (Addendum)
Pt reports she fell on the ice today at work and landed on her bottom ans right hip. Reports she has pain in her stomach her hip and her chest. Reports she has history of a DVT and PE. Had positive HPT 3 weeks ago at home.

## 2012-08-19 LAB — URINE CULTURE: Colony Count: 40000

## 2012-08-19 NOTE — MAU Provider Note (Signed)
Patient was strongly encouraged to follow up with [facility] to rule out PE. It was made clear to the patient that the dosing of the Lovenox she was prescribed in different to the dosing needed to treat a DVT or PE. Patient verbalized understanding.  Attestation of Attending Supervision of Advanced Practitioner (CNM/NP): Evaluation and management procedures were performed by the Advanced Practitioner under my supervision and collaboration.  I have reviewed the Advanced Practitioner's note and chart, and I agree with the management and plan.  CONSTANT,PEGGY 08/19/2012 1:19 PM

## 2012-08-20 ENCOUNTER — Encounter: Payer: Self-pay | Admitting: Obstetrics & Gynecology

## 2012-08-20 ENCOUNTER — Other Ambulatory Visit: Payer: Self-pay

## 2012-08-20 LAB — GC/CHLAMYDIA PROBE AMP
CT Probe RNA: NEGATIVE
GC Probe RNA: NEGATIVE

## 2012-08-24 ENCOUNTER — Inpatient Hospital Stay (HOSPITAL_COMMUNITY): Payer: No Typology Code available for payment source

## 2012-08-24 ENCOUNTER — Encounter (HOSPITAL_COMMUNITY): Payer: Self-pay | Admitting: *Deleted

## 2012-08-24 ENCOUNTER — Inpatient Hospital Stay (HOSPITAL_COMMUNITY)
Admission: AD | Admit: 2012-08-24 | Discharge: 2012-08-24 | Disposition: A | Discharge status: Home / Self Care | Payer: No Typology Code available for payment source | Source: Ambulatory Visit | Attending: Family Medicine | Admitting: Family Medicine

## 2012-08-24 DIAGNOSIS — O039 Complete or unspecified spontaneous abortion without complication: Secondary | ICD-10-CM | POA: Insufficient documentation

## 2012-08-24 NOTE — MAU Provider Note (Signed)
History     CSN: [id number]  Arrival date and time: 08/24/12 0842   None     No chief complaint on file.  HPI Ms Heringer is a 27yo [id number] at 7.5wks who presents for eval of vag bldg and cramping that began last PM. She was seen in MAU on 3/8 after falling on the ice and subsequently developing chest pain that was similar to pain from a PE in the past (hx DVT and PE). At that time she did not have vag bldg but was + for abd cramping.  After confirming viability via U/S pt was urged to go to [facility] for further workup- pt did not go. She did receive a dose of Lovenox prior to d/c and was also tx for BV. GC/chlam and urine culture neg from 3/8. Blood type B+.  OB History   Grav Para Term Preterm Abortions TAB SAB Ect Mult Living   5 2 2  2  2   2       Past Medical History  Diagnosis Date  . Pulmonary emboli     Past Surgical History  Procedure Laterality Date  . Cesarean section      Family History  Problem Relation Age of Onset  . Alcohol abuse Neg Hx   . Arthritis Neg Hx   . Asthma Neg Hx   . Birth defects Neg Hx   . Cancer Neg Hx   . COPD Neg Hx   . Depression Neg Hx   . Diabetes Neg Hx   . Drug abuse Neg Hx   . Early death Neg Hx   . Hearing loss Neg Hx   . Heart disease Neg Hx   . Hyperlipidemia Neg Hx   . Hypertension Neg Hx   . Kidney disease Neg Hx   . Learning disabilities Neg Hx   . Mental illness Neg Hx   . Mental retardation Neg Hx   . Miscarriages / Stillbirths Neg Hx   . Stroke Neg Hx   . Vision loss Neg Hx     History  Substance Use Topics  . Smoking status: Never Smoker   . Smokeless tobacco: Not on file  . Alcohol Use: No    Allergies: No Known Allergies  No prescriptions prior to admission    ROS Physical Exam   Blood pressure 129/74, pulse 87, temperature 98 F (36.7 C), temperature source Oral, resp. rate 18, height 5\' 3"  (1.6 m), weight 185 lb 6.4 oz (84.097 kg), last menstrual period 07/01/2012.  Physical Exam   Constitutional: She is oriented to person, place, and time. She appears well-developed.  HENT:  Head: Normocephalic.  Cardiovascular: Normal rate.   Respiratory: Effort normal.  GI: Soft.  Genitourinary:  SSE: sm to mod amt of red/wine colored vag bldg; cx C/L  Musculoskeletal: Normal range of motion.  Neurological: She is alert and oriented to person, place, and time.  Skin: Skin is warm and dry.  Psychiatric: She has a normal mood and affect. Her behavior is normal. Thought content normal.    MAU Course  Procedures  MDM U/S for viability:   Assessment and Plan  IUP at 7.5wks Threatened AB  SHAW, KIMBERLY 08/24/2012, 9:46 AM      A/P: 1. SAB (spontaneous abortion)   rev'd precautions, f/u in clinic in 2 weeks for repeat quant and to discuss contraception - pt states she does not desire another pregnancy at this time    Medication List     As of  08/25/2012  7:12 PM    Notice      You have not been prescribed any medications.             Follow-up Information   Follow up with [facility] (someone will call to schedule)    Contact information:   [address] [place] [address] [phone]

## 2012-08-24 NOTE — MAU Note (Signed)
Pt reports "alot" of bleeding since last night. Pt also reports some cramping.

## 2012-08-26 NOTE — MAU Provider Note (Signed)
Chart reviewed and agree with management and plan.  

## 2012-09-12 ENCOUNTER — Encounter: Payer: Self-pay | Admitting: Family

## 2013-06-22 ENCOUNTER — Encounter (HOSPITAL_COMMUNITY): Payer: Self-pay | Admitting: *Deleted

## 2013-07-14 ENCOUNTER — Emergency Department (HOSPITAL_COMMUNITY): Payer: BC Managed Care – PPO

## 2013-07-14 ENCOUNTER — Encounter (HOSPITAL_COMMUNITY): Payer: Self-pay | Admitting: Emergency Medicine

## 2013-07-14 ENCOUNTER — Emergency Department (HOSPITAL_COMMUNITY)
Admission: EM | Admit: 2013-07-14 | Discharge: 2013-07-14 | Disposition: A | Discharge status: Home / Self Care | Payer: BC Managed Care – PPO | Attending: Emergency Medicine | Admitting: Emergency Medicine

## 2013-07-14 DIAGNOSIS — Z86711 Personal history of pulmonary embolism: Secondary | ICD-10-CM | POA: Insufficient documentation

## 2013-07-14 DIAGNOSIS — B9789 Other viral agents as the cause of diseases classified elsewhere: Secondary | ICD-10-CM | POA: Insufficient documentation

## 2013-07-14 DIAGNOSIS — L0231 Cutaneous abscess of buttock: Secondary | ICD-10-CM | POA: Insufficient documentation

## 2013-07-14 DIAGNOSIS — B349 Viral infection, unspecified: Secondary | ICD-10-CM

## 2013-07-14 DIAGNOSIS — L03317 Cellulitis of buttock: Principal | ICD-10-CM

## 2013-07-14 MED ORDER — SULFAMETHOXAZOLE-TRIMETHOPRIM 800-160 MG PO TABS: 1.0000 | ORAL_TABLET | Freq: Two times a day (BID) | ORAL | Status: AC

## 2013-07-14 MED ORDER — LIDOCAINE-EPINEPHRINE 2 %-1:100000 IJ SOLN
20.0000 mL | Freq: Once | INTRAMUSCULAR | Status: DC
  Filled 2013-07-14: qty 20

## 2013-07-14 MED ORDER — CEPHALEXIN 500 MG PO CAPS: 500.0000 mg | ORAL_CAPSULE | Freq: Four times a day (QID) | ORAL | Status: DC

## 2013-07-14 MED ORDER — HYDROCODONE-ACETAMINOPHEN 7.5-325 MG/15ML PO SOLN: 10.0000 mL | Freq: Four times a day (QID) | ORAL | Status: DC | PRN

## 2013-07-14 NOTE — Discharge Instructions (Signed)
Continue to apply warm compress to abscess area 10-15 min each 4-5 times daily for the next several days.  Remove packing in 2 days.  Take antibiotics and medication as prescribed.  Return if your symptoms worsen or if you have other concerns.    Abscess Care After An abscess (also called a boil or furuncle) is an infected area that contains a collection of pus. Signs and symptoms of an abscess include pain, tenderness, redness, or hardness, or you may feel a moveable soft area under your skin. An abscess can occur anywhere in the body. The infection may spread to surrounding tissues causing cellulitis. A cut (incision) by the surgeon was made over your abscess and the pus was drained out. Gauze may have been packed into the space to provide a drain that will allow the cavity to heal from the inside outwards. The boil may be painful for 5 to 7 days. Most people with a boil do not have high fevers. Your abscess, if seen early, may not have localized, and may not have been lanced. If not, another appointment may be required for this if it does not get better on its own or with medications. HOME CARE INSTRUCTIONS   Only take over-the-counter or prescription medicines for pain, discomfort, or fever as directed by your caregiver.  When you bathe, soak and then remove gauze or iodoform packs at least daily or as directed by your caregiver. You may then wash the wound gently with mild soapy water. Repack with gauze or do as your caregiver directs. SEEK IMMEDIATE MEDICAL CARE IF:   You develop increased pain, swelling, redness, drainage, or bleeding in the wound site.  You develop signs of generalized infection including muscle aches, chills, fever, or a general ill feeling.  An oral temperature above 102 F (38.9 C) develops, not controlled by medication. See your caregiver for a recheck if you develop any of the symptoms described above. If medications (antibiotics) were prescribed, take them as  directed. Document Released: 12/15/2004 Document Revised: 08/21/2011 Document Reviewed: 08/12/2007 [facility] Patient Information 2014 [place], [place].  Viral Infections A virus is a type of germ. Viruses can cause:  Minor sore throats.  Aches and pains.  Headaches.  Runny nose.  Rashes.  Watery eyes.  Tiredness.  Coughs.  Loss of appetite.  Feeling sick to your stomach (nausea).  Throwing up (vomiting).  Watery poop (diarrhea). HOME CARE   Only take medicines as told by your doctor.  Drink enough water and fluids to keep your pee (urine) clear or pale yellow. Sports drinks are a good choice.  Get plenty of rest and eat healthy. Soups and broths with crackers or rice are fine. GET HELP RIGHT AWAY IF:   You have a very bad headache.  You have shortness of breath.  You have chest pain or neck pain.  You have an unusual rash.  You cannot stop throwing up.  You have watery poop that does not stop.  You cannot keep fluids down.  You or your child has a temperature by mouth above 102 F (38.9 C), not controlled by medicine.  Your baby is older than 3 months with a rectal temperature of 102 F (38.9 C) or higher.  Your baby is 73 months old or younger with a rectal temperature of 100.4 F (38 C) or higher. MAKE SURE YOU:   Understand these instructions.  Will watch this condition.  Will get help right away if you are not doing well or get  worse. Document Released: 05/11/2008 Document Revised: 08/21/2011 Document Reviewed: 10/04/2010 [facility] Patient Information 2014 [place], [place].

## 2013-07-14 NOTE — ED Notes (Signed)
Painful abcess on bottom and sts larger since Friday and also reports body aches and flu like symptoms for 1 month

## 2013-07-14 NOTE — ED Provider Notes (Signed)
Medical screening examination/treatment/procedure(s) were performed by non-physician practitioner and as supervising physician I was immediately available for consultation/collaboration.  EKG Interpretation   None        Jasilyn Holderman T Donzell Coller, MD 07/14/13 2308 

## 2013-07-14 NOTE — ED Notes (Signed)
Assisted [place], [place] with i&d procedure. Pt tolerated well.

## 2013-07-14 NOTE — ED Provider Notes (Signed)
CSN: [id number]     Arrival date & time 07/14/13  1243 History   First MD Initiated Contact with Patient 07/14/13 1605     Chief Complaint  Patient presents with  . Influenza  . Abscess   (Consider location/radiation/quality/duration/timing/severity/associated sxs/prior Treatment) HPI  28 year old female here with flu-like symptoms and a gluteal abscess. Patient reports for the past month she has had flulike symptoms including generalized body aches, sneeze, runny nose, dry cough which she has been taking over-the-counter medication without relief. The cough is persistent, keeping her up at night. She has posttussis emesis from coughing. She also reports having a boil to her left buttock ongoing for the past 4 days. Complaining of sharp burning pain to the affected area, worsening with sitting. No specific treatment tried, no prior history of abscess. Patient is a nonsmoker. Patient currently not taking any blood thinner medication as she is cleared from PE and DVT relating to birth control use back in 2011.  Past Medical History  Diagnosis Date  . Pulmonary emboli    Past Surgical History  Procedure Laterality Date  . Cesarean section     Family History  Problem Relation Age of Onset  . Alcohol abuse Neg Hx   . Arthritis Neg Hx   . Asthma Neg Hx   . Birth defects Neg Hx   . Cancer Neg Hx   . COPD Neg Hx   . Depression Neg Hx   . Diabetes Neg Hx   . Drug abuse Neg Hx   . Early death Neg Hx   . Hearing loss Neg Hx   . Heart disease Neg Hx   . Hyperlipidemia Neg Hx   . Hypertension Neg Hx   . Kidney disease Neg Hx   . Learning disabilities Neg Hx   . Mental illness Neg Hx   . Mental retardation Neg Hx   . Miscarriages / Stillbirths Neg Hx   . Stroke Neg Hx   . Vision loss Neg Hx    History  Substance Use Topics  . Smoking status: Never Smoker   . Smokeless tobacco: Not on file  . Alcohol Use: No   OB History   Grav Para Term Preterm Abortions TAB SAB Ect Mult Living    5 2 2  2  2   2      Review of Systems  All other systems reviewed and are negative.    Allergies  Review of patient's allergies indicates no known allergies.  Home Medications  No current outpatient prescriptions on file. BP 127/80  Pulse 90  Temp(Src) 99 F (37.2 C) (Oral)  Resp 18  Ht 5\' 4"  (1.626 m)  Wt 175 lb (79.379 kg)  BMI 30.02 kg/m2  SpO2 99% Physical Exam  Nursing note and vitals reviewed. Constitutional: She is oriented to person, place, and time. She appears well-developed and well-nourished. No distress.  HENT:  Head: Atraumatic.  Right Ear: External ear normal.  Left Ear: External ear normal.  Nose: Nose normal.  Mouth/Throat: Oropharynx is clear and moist.  Eyes: Conjunctivae are normal.  Neck: Neck supple.  Cardiovascular: Normal rate and regular rhythm.   Pulmonary/Chest: Effort normal and breath sounds normal. No respiratory distress. She has no wheezes. She has no rales. She exhibits no tenderness.  Abdominal: Soft. There is no tenderness.  Neurological: She is alert and oriented to person, place, and time.  Skin: No rash noted.  L buttock with and area of induration and mild flutuance with associated erythema  and an ulcerated lesion in the middle. Ttp.    Psychiatric: She has a normal mood and affect.    ED Course  Procedures (including critical care time)  5:06 PM Patient here with multiple complaints. First she has URI symptoms which has been ongoing for one month. Her vital signs are unremarkable. Due to the duration of the symptoms, chest x-ray ordered to rule out pneumonia. Patient also has an abscess to the left gluteus. No prior history of abscess in the past. I&D procedure was performed with success. Patient tolerates well. There is surrounding erythema consider for cellulitis. Will prescribe antibiotic  INCISION AND DRAINAGE Performed by: Fayrene HelperRAN,Tanisia Yokley Consent: Verbal consent obtained. Risks and benefits: risks, benefits and alternatives  were discussed Type: abscess  Body area: L gluteus  Anesthesia: local infiltration  Incision was made with a scalpel.  Local anesthetic: lidocaine 2% w epinephrine  Anesthetic total: 4 ml  Complexity: complex Blunt dissection to break up loculations  Drainage: purulent  Drainage amount: moderate  Packing material: 1/4 in iodoform gauze  Patient tolerance: Patient tolerated the procedure well with no immediate complications.  6:25 PM Since pt has cellulitic skin changes over abscess will prescribe keflex/bactrim as treatment.  Packing placed, pt made aware to use warm compress and to remove packing in 2 days.  Also provide hycet for cough and pain control.  Return precaution discussed.     Labs Review Labs Reviewed - No data to display Imaging Review No results found.  EKG Interpretation   None       MDM   1. Viral syndrome   2. Abscess of left buttock    BP 121/62  Pulse 70  Temp(Src) 98.4 F (36.9 C) (Oral)  Resp 16  Ht 5\' 4"  (1.626 m)  Wt 175 lb (79.379 kg)  BMI 30.02 kg/m2  SpO2 100%  LMP 07/03/2013  I have reviewed nursing notes and vital signs. I reviewed available ER/hospitalization records thought the EMR     Fayrene HelperBowie Damisha Wolff, [place] JerseyPA-C 07/14/13 1826

## 2013-09-11 ENCOUNTER — Emergency Department (HOSPITAL_COMMUNITY): Payer: BC Managed Care – PPO

## 2013-09-11 ENCOUNTER — Emergency Department (HOSPITAL_COMMUNITY)
Admission: EM | Admit: 2013-09-11 | Discharge: 2013-09-11 | Disposition: A | Discharge status: Home / Self Care | Payer: BC Managed Care – PPO | Attending: Emergency Medicine | Admitting: Emergency Medicine

## 2013-09-11 ENCOUNTER — Encounter (HOSPITAL_COMMUNITY): Payer: Self-pay | Admitting: Emergency Medicine

## 2013-09-11 DIAGNOSIS — Z86718 Personal history of other venous thrombosis and embolism: Secondary | ICD-10-CM | POA: Insufficient documentation

## 2013-09-11 DIAGNOSIS — M79609 Pain in unspecified limb: Secondary | ICD-10-CM

## 2013-09-11 DIAGNOSIS — Z3202 Encounter for pregnancy test, result negative: Secondary | ICD-10-CM | POA: Insufficient documentation

## 2013-09-11 DIAGNOSIS — M7989 Other specified soft tissue disorders: Secondary | ICD-10-CM | POA: Insufficient documentation

## 2013-09-11 DIAGNOSIS — R079 Chest pain, unspecified: Secondary | ICD-10-CM

## 2013-09-11 DIAGNOSIS — M79606 Pain in leg, unspecified: Secondary | ICD-10-CM

## 2013-09-11 DIAGNOSIS — Z86711 Personal history of pulmonary embolism: Secondary | ICD-10-CM | POA: Insufficient documentation

## 2013-09-11 LAB — BASIC METABOLIC PANEL
BUN: 7 mg/dL (ref 6–23)
CO2: 23 mEq/L (ref 19–32)
Calcium: 9.4 mg/dL (ref 8.4–10.5)
Chloride: 99 mEq/L (ref 96–112)
Creatinine, Ser: 0.77 mg/dL (ref 0.50–1.10)
GFR calc Af Amer: >90 mL/min (ref >90)
GFR calc non Af Amer: >90 mL/min (ref >90)
Glucose, Bld: 85 mg/dL (ref 70–99)
Potassium: 3.7 mEq/L (ref 3.7–5.3)
Sodium: 136 mEq/L — ABNORMAL LOW (ref 137–147)

## 2013-09-11 LAB — CBC
HCT: 38.2 % (ref 36.0–46.0)
Hemoglobin: 13.4 g/dL (ref 12.0–15.0)
MCH: 29 pg (ref 26.0–34.0)
MCHC: 35.1 g/dL (ref 30.0–36.0)
MCV: 82.7 fL (ref 78.0–100.0)
Platelets: 295 10*3/uL (ref 150–400)
RBC: 4.62 MIL/uL (ref 3.87–5.11)
RDW: 12.4 % (ref 11.5–15.5)
WBC: 5.8 10*3/uL (ref 4.0–10.5)

## 2013-09-11 LAB — I-STAT TROPONIN, ED: Troponin i, poc: 0 ng/mL (ref 0.00–0.08)

## 2013-09-11 LAB — POC URINE PREG, ED: Preg Test, Ur: NEGATIVE

## 2013-09-11 LAB — PRO B NATRIURETIC PEPTIDE: Pro B Natriuretic peptide (BNP): 29.1 pg/mL (ref 0–125)

## 2013-09-11 MED ORDER — IBUPROFEN 600 MG PO TABS: 600.0000 mg | ORAL_TABLET | Freq: Four times a day (QID) | ORAL | Status: DC | PRN

## 2013-09-11 MED ORDER — IOHEXOL 350 MG/ML SOLN
100.0000 mL | Freq: Once | INTRAVENOUS | Status: AC | PRN
  Administered 2013-09-11: 100 mL via INTRAVENOUS

## 2013-09-11 NOTE — Progress Notes (Signed)
VASCULAR LAB PRELIMINARY  PRELIMINARY  PRELIMINARY  PRELIMINARY  Left lower extremity venous duplex completed.    Preliminary report:  Left:  No evidence of DVT, superficial thrombosis, or Baker's cyst.  Lynnett Langlinais, RVT 09/11/2013, 3:55 PM

## 2013-09-11 NOTE — ED Notes (Signed)
Pt c/o chest pain x 1 year, but states today is worse. States she had blood clots in her lungs was put on medications but taken off of them. Pt state she has slight SOB.

## 2013-09-11 NOTE — ED Provider Notes (Signed)
CSN: [id number]     Arrival date & time 09/11/13  1305 History   First MD Initiated Contact with Patient 09/11/13 1415     Chief Complaint  Patient presents with  . Chest Pain     (Consider location/radiation/quality/duration/timing/severity/associated sxs/prior Treatment) HPI Comments: Patient with history of DVT and PE thought to be related to estrogen use in 2012 -- presents with complaint of chest pain shortness of breath. Patient states that she has had intermittent chest pain for approximately one year however her pain returned this morning and was much worse. Patient states that she had similar pain with her pulmonary embolism in the past. Pain does not get worse with deep breathing. She has had some mild shortness of breath. No cough or hemoptysis. No injury to chest wall. No fever. No recent long segments of travel. Patient notes that her left calf has been tender but not swollen. No treatments prior to arrival. Onset of symptoms acute. Course is constant. Nothing makes symptoms better or worse.  Patient is a 28 y.o. female presenting with chest pain. The history is provided by the patient.  Chest Pain Associated symptoms: no abdominal pain, no cough, no fever, no headache, no nausea and not vomiting     Past Medical History  Diagnosis Date  . Pulmonary emboli    Past Surgical History  Procedure Laterality Date  . Cesarean section     Family History  Problem Relation Age of Onset  . Alcohol abuse Neg Hx   . Arthritis Neg Hx   . Asthma Neg Hx   . Birth defects Neg Hx   . Cancer Neg Hx   . COPD Neg Hx   . Depression Neg Hx   . Diabetes Neg Hx   . Drug abuse Neg Hx   . Early death Neg Hx   . Hearing loss Neg Hx   . Heart disease Neg Hx   . Hyperlipidemia Neg Hx   . Hypertension Neg Hx   . Kidney disease Neg Hx   . Learning disabilities Neg Hx   . Mental illness Neg Hx   . Mental retardation Neg Hx   . Miscarriages / Stillbirths Neg Hx   . Stroke Neg Hx   . Vision  loss Neg Hx    History  Substance Use Topics  . Smoking status: Never Smoker   . Smokeless tobacco: Not on file  . Alcohol Use: No   OB History   Grav Para Term Preterm Abortions TAB SAB Ect Mult Living   5 2 2  2  2   2      Review of Systems  Constitutional: Negative for fever.  HENT: Negative for rhinorrhea and sore throat.   Eyes: Negative for redness.  Respiratory: Negative for cough.   Cardiovascular: Positive for chest pain.  Gastrointestinal: Negative for nausea, vomiting, abdominal pain and diarrhea.  Genitourinary: Negative for dysuria.  Musculoskeletal: Positive for myalgias (L calf tender).  Skin: Negative for rash.  Neurological: Negative for headaches.    Allergies  Review of patient's allergies indicates no known allergies.  Home Medications  No current outpatient prescriptions on file. BP 126/74  Pulse 74  Temp(Src) 98.4 F (36.9 C) (Oral)  Resp 21  SpO2 100%  LMP 08/19/2013 Physical Exam  Nursing note and vitals reviewed. Constitutional: She appears well-developed and well-nourished.  HENT:  Head: Normocephalic and atraumatic.  Eyes: Conjunctivae are normal. Right eye exhibits no discharge. Left eye exhibits no discharge.  Neck: Normal range  of motion. Neck supple.  Cardiovascular: Normal rate and regular rhythm.   No murmur heard. Pulmonary/Chest: Effort normal and breath sounds normal. No respiratory distress. She has no wheezes. She has no rales.  Abdominal: Soft. There is no tenderness. There is no rebound and no guarding.  Musculoskeletal: She exhibits tenderness. She exhibits no edema.  Mild ttp L calf without erythema or edema. R calf unremarkable.   Neurological: She is alert.  Skin: Skin is warm and dry.  Psychiatric: She has a normal mood and affect.    ED Course  Procedures (including critical care time) Labs Review Labs Reviewed  BASIC METABOLIC PANEL - Abnormal; Notable for the following:    Sodium 136 (*)    All other  components within normal limits  CBC  PRO B NATRIURETIC PEPTIDE  I-STAT TROPOININ, ED  POC URINE PREG, ED   Imaging Review Ct Angio Chest Pe W/cm &/or Wo Cm  09/11/2013   CLINICAL DATA:  Chest pain  EXAM: CT ANGIOGRAPHY CHEST WITH CONTRAST  TECHNIQUE: Multidetector CT imaging of the chest was performed using the standard protocol during bolus administration of intravenous contrast. Multiplanar CT image reconstructions and MIPs were obtained to evaluate the vascular anatomy.  CONTRAST:  100mL OMNIPAQUE IOHEXOL 350 MG/ML SOLN  COMPARISON:  Chest CT July 26, 2010 and chest radiograph July 14, 2013  FINDINGS: There is no demonstrable pulmonary embolus. There is no thoracic aortic aneurysm or dissection.  Lungs are clear. There is no appreciable thoracic adenopathy. Pericardium is not thickened. There is some residual thymic tissue which may be normal in this age group.  Visualized upper abdominal structures appear normal. There are no blastic or lytic bone lesions.  Review of the MIP images confirms the above findings.  IMPRESSION: No demonstrable pulmonary embolus.  Lungs are clear.   Electronically Signed   By: Bretta BangWilliam  Woodruff M.D.   On: 09/11/2013 15:25     EKG Interpretation None      2:27 PM Patient seen and examined. Work-up initiated. Moderate risk -- needs CT for r/o.    Vital signs reviewed and are as follows: Filed Vitals:   09/11/13 1345  BP: 126/74  Pulse: 74  Temp:   Resp: 21  BP 126/74  Pulse 74  Temp(Src) 98.4 F (36.9 C) (Oral)  Resp 21  SpO2 100%  LMP 08/19/2013  3:56 PM Both CT and doppler neg. Pt informed. NSAIDs for pain. F/u with PCP. Patient urged to return with worsening symptoms or other concerns. Patient verbalized understanding and agrees with plan.    Date: 09/11/2013  Rate: 68  Rhythm: normal sinus rhythm  QRS Axis: normal  Intervals: normal  ST/T Wave abnormalities: normal  Conduction Disutrbances:none  Narrative Interpretation:   Old EKG  Reviewed: none available  MDM   Final diagnoses:  Chest pain  Leg pain   Pt with intermittent CP x 1 year worse today. Also with leg swelling. Patient was concerned she had recurrence of DVT/PE. These symptoms are similar. CT neg. Doppler neg. EKG normal. No PNA seen on CT. Will treat for musculoskeletal pain. Pt appears well.   No dangerous or life-threatening conditions suspected or identified by history, physical exam, and by work-up. No indications for hospitalization identified.      Renne CriglerJoshua Amala Petion, PA-C 09/11/13 1558

## 2013-09-11 NOTE — Discharge Instructions (Signed)
Please read and follow all provided instructions.  Your diagnoses today include:  1. Chest pain   2. Leg pain     Tests performed today include:  Blood counts and electrolytes - normal  CT of your chest - no blood clots or other causes for pain seen  Ultrasound of leg - no blood blot in leg  Vital signs. See below for your results today.   Medications prescribed:   Ibuprofen (Motrin, Advil) - anti-inflammatory pain medication  Do not exceed 600mg  ibuprofen every 6 hours, take with food  You have been prescribed an anti-inflammatory medication or NSAID. Take with food. Take smallest effective dose for the shortest duration needed for your pain. Stop taking if you experience stomach pain or vomiting.   Take any prescribed medications only as directed.  Home care instructions:  Follow any educational materials contained in this packet.  BE VERY CAREFUL not to take multiple medicines containing Tylenol (also called acetaminophen). Doing so can lead to an overdose which can damage your liver and cause liver failure and possibly death.   Follow-up instructions: Please follow-up with your primary care provider in the next 7 days for further evaluation of your symptoms. If you do not have a primary care doctor -- see below for referral information.   Return instructions:   Please return to the Emergency Department if you experience worsening symptoms.   Return with worsening pains, shortness of breath  Please return if you have any other emergent concerns.  Additional Information:  Your vital signs today were: BP 126/74   Pulse 74   Temp(Src) 98.4 F (36.9 C) (Oral)   Resp 21   SpO2 100%   LMP 08/19/2013 If your blood pressure (BP) was elevated above 135/85 this visit, please have this repeated by your doctor within one month. --------------

## 2013-09-12 NOTE — ED Provider Notes (Signed)
Medical screening examination/treatment/procedure(s) were performed by non-physician practitioner and as supervising physician I was immediately available for consultation/collaboration.   EKG Interpretation None       Flint MelterElliott L Rosevelt Luu, MD 09/12/13 [phone]

## 2013-10-02 ENCOUNTER — Emergency Department (HOSPITAL_COMMUNITY)
Admission: EM | Admit: 2013-10-02 | Discharge: 2013-10-02 | Disposition: A | Discharge status: Home / Self Care | Payer: BC Managed Care – PPO | Attending: Emergency Medicine | Admitting: Emergency Medicine

## 2013-10-02 ENCOUNTER — Emergency Department (HOSPITAL_COMMUNITY): Payer: BC Managed Care – PPO

## 2013-10-02 ENCOUNTER — Encounter (HOSPITAL_COMMUNITY): Payer: Self-pay | Admitting: Emergency Medicine

## 2013-10-02 DIAGNOSIS — Z86711 Personal history of pulmonary embolism: Secondary | ICD-10-CM | POA: Insufficient documentation

## 2013-10-02 DIAGNOSIS — R209 Unspecified disturbances of skin sensation: Secondary | ICD-10-CM | POA: Insufficient documentation

## 2013-10-02 DIAGNOSIS — M79646 Pain in unspecified finger(s): Secondary | ICD-10-CM

## 2013-10-02 DIAGNOSIS — M79609 Pain in unspecified limb: Secondary | ICD-10-CM | POA: Insufficient documentation

## 2013-10-02 DIAGNOSIS — M7989 Other specified soft tissue disorders: Secondary | ICD-10-CM | POA: Insufficient documentation

## 2013-10-02 DIAGNOSIS — M25549 Pain in joints of unspecified hand: Secondary | ICD-10-CM | POA: Insufficient documentation

## 2013-10-02 MED ORDER — IBUPROFEN 800 MG PO TABS: 800.0000 mg | ORAL_TABLET | Freq: Three times a day (TID) | ORAL | Status: DC | PRN

## 2013-10-02 MED ORDER — IBUPROFEN 800 MG PO TABS
800.0000 mg | ORAL_TABLET | Freq: Once | ORAL | Status: AC
Start: 2013-10-02 — End: 2013-10-02
  Administered 2013-10-02: 800 mg via ORAL
  Filled 2013-10-02: qty 1

## 2013-10-02 NOTE — ED Provider Notes (Signed)
CSN: [id number]     Arrival date & time 10/02/13  1606 History  This chart was scribed for non-physician practitioner working with Audree CamelScott T Goldston, MD by Ashley JacobsBrittany Andrews, ED scribe. This patient was seen in room WTR6/WTR6 and the patient's care was started at 4:40 PM.  First MD Initiated Contact with Patient 10/02/13 1629     Chief Complaint  Patient presents with  . Finger Injury     (Consider location/radiation/quality/duration/timing/severity/associated sxs/prior Treatment) HPI HPI Comments: Susan Austin is a 28 y.o. female who presents to the Emergency Department complaining of left middle finger injury and hand pain, onset yesterday. The reports cracking her knuckles two days ago. Denies initial pain and swelling. The pain presented yesterday even and was much worse this morning. She has swelling to her finger. Denies trying anything for pain. Pt has mild pain to her index and palm. The pain is 10/10 in severity. She has numbness and tingling to her palm and left middle finger.  She is unable to make a fist. The pain is worse with movement and touch. Denies insect or tick bite. Denies recent sickness. She is right handed. Denies fever, chills and myalgia.    Past Medical History  Diagnosis Date  . Pulmonary emboli    Past Surgical History  Procedure Laterality Date  . Cesarean section     Family History  Problem Relation Age of Onset  . Alcohol abuse Neg Hx   . Arthritis Neg Hx   . Asthma Neg Hx   . Birth defects Neg Hx   . Cancer Neg Hx   . COPD Neg Hx   . Depression Neg Hx   . Diabetes Neg Hx   . Drug abuse Neg Hx   . Early death Neg Hx   . Hearing loss Neg Hx   . Heart disease Neg Hx   . Hyperlipidemia Neg Hx   . Hypertension Neg Hx   . Kidney disease Neg Hx   . Learning disabilities Neg Hx   . Mental illness Neg Hx   . Mental retardation Neg Hx   . Miscarriages / Stillbirths Neg Hx   . Stroke Neg Hx   . Vision loss Neg Hx    History  Substance Use  Topics  . Smoking status: Never Smoker   . Smokeless tobacco: Not on file  . Alcohol Use: No   OB History   Grav Para Term Preterm Abortions TAB SAB Ect Mult Living   5 2 2  2  2   2      Review of Systems  Constitutional: Negative for fever and chills.  Musculoskeletal: Positive for arthralgias and myalgias.       No generalized body pain.   Skin: Negative for color change, pallor, rash and wound.  Neurological: Positive for numbness. Negative for weakness.  All other systems reviewed and are negative.     Allergies  Review of patient's allergies indicates no known allergies.  Home Medications   Prior to Admission medications   Not on File   BP 140/90  Pulse 94  Temp(Src) 99 F (37.2 C) (Oral)  Resp 12  Wt 180 lb (81.647 kg)  SpO2 100%  LMP 09/27/2013 Physical Exam  Nursing note and vitals reviewed. Constitutional: She appears well-developed and well-nourished. No distress.  HENT:  Head: Normocephalic and atraumatic.  Neck: Neck supple.  Pulmonary/Chest: Effort normal.  Musculoskeletal:  Tender of the 3rd MCP joint over the dorsal and palmer aspect, slightly tenderness over  4th MCP No edema, erythema, warmth No deformities noted DP sensation intact Decreased active ROM secondary to pain Passive ROM with pain Capillary refill is less than 2 seconds  Neurological: She is alert.  Skin: She is not diaphoretic.    ED Course  Procedures (including critical care time) DIAGNOSTIC STUDIES: Oxygen Saturation is 100% on room air, normal by my interpretation.    COORDINATION OF CARE:  4:43 PM Discussed course of care with pt . Pt understands and agrees.   Labs Review Labs Reviewed - No data to display  Imaging Review Dg Hand Complete Left  10/02/2013   CLINICAL DATA:  Finger injury, pain of the left middle finger  EXAM: LEFT HAND - COMPLETE 3+ VIEW  COMPARISON:  None.  FINDINGS: There is no evidence of fracture or dislocation. There is no evidence of  arthropathy or other focal bone abnormality. Soft tissues are unremarkable.  IMPRESSION: Negative.   Electronically Signed   By: Elige KoHetal  Patel   On: 10/02/2013 17:32     EKG Interpretation None      Pt with increased AROM after ibuprofen  MDM   Final diagnoses:  Finger pain    Pt with 3rd MCP pain that gradually developed over 2 days.  Only possible injury was cracking her knuckle.  No s/s infection.  Xray negative.  Pt felt improved after ibuprofen.  D/C home with same.  Discussed result, findings, treatment, and follow up  with patient.  Pt given return precautions.  Pt verbalizes understanding and agrees with plan.      I personally performed the services described in this documentation, which was scribed in my presence. The recorded information has been reviewed and is accurate.     Trixie Dredgemily Tiffeny Minchew, PA-C 10/02/13 1827

## 2013-10-02 NOTE — ED Notes (Signed)
Pt arrives from home with c/o left middle finger pain and hand pain, states she was cracking her fingers and "I think I broke my finger."  Pt A/ox4, NAD. Swelling noted to left middle phalange joints. Rates her pain 10/10.

## 2013-10-02 NOTE — ED Notes (Signed)
Patient transported to X-ray 

## 2013-10-02 NOTE — Discharge Instructions (Signed)
Read the information below.  Use the prescribed medication as directed.  Please discuss all new medications with your pharmacist.  You may return to the Emergency Department at any time for worsening condition or any new symptoms that concern you.  If there is any possibility that you might be pregnant, please let your health care provider know and discuss this with the pharmacist to ensure medication safety.  If you develop uncontrolled pain, weakness or numbness of the extremity, severe discoloration of the skin, or you are unable to move your fingers, return to the ER for a recheck.     Musculoskeletal Pain Musculoskeletal pain is muscle and boney aches and pains. These pains can occur in any part of the body. Your caregiver may treat you without knowing the cause of the pain. They may treat you if blood or urine tests, X-rays, and other tests were normal.  CAUSES There is often not a definite cause or reason for these pains. These pains may be caused by a type of germ (virus). The discomfort may also come from overuse. Overuse includes working out too hard when your body is not fit. Boney aches also come from weather changes. Bone is sensitive to atmospheric pressure changes. HOME CARE INSTRUCTIONS   Ask when your test results will be ready. Make sure you get your test results.  Only take over-the-counter or prescription medicines for pain, discomfort, or fever as directed by your caregiver. If you were given medications for your condition, do not drive, operate machinery or power tools, or sign legal documents for 24 hours. Do not drink alcohol. Do not take sleeping pills or other medications that may interfere with treatment.  Continue all activities unless the activities cause more pain. When the pain lessens, slowly resume normal activities. Gradually increase the intensity and duration of the activities or exercise.  During periods of severe pain, bed rest may be helpful. Lay or sit in any  position that is comfortable.  Putting ice on the injured area.  Put ice in a bag.  Place a towel between your skin and the bag.  Leave the ice on for 15 to 20 minutes, 3 to 4 times a day.  Follow up with your caregiver for continued problems and no reason can be found for the pain. If the pain becomes worse or does not go away, it may be necessary to repeat tests or do additional testing. Your caregiver may need to look further for a possible cause. SEEK IMMEDIATE MEDICAL CARE IF:  You have pain that is getting worse and is not relieved by medications.  You develop chest pain that is associated with shortness or breath, sweating, feeling sick to your stomach (nauseous), or throw up (vomit).  Your pain becomes localized to the abdomen.  You develop any new symptoms that seem different or that concern you. MAKE SURE YOU:   Understand these instructions.  Will watch your condition.  Will get help right away if you are not doing well or get worse. Document Released: 05/29/2005 Document Revised: 08/21/2011 Document Reviewed: 01/31/2013 [facility] Patient Information 2014 [employer], [place].

## 2013-10-06 NOTE — ED Provider Notes (Signed)
Medical screening examination/treatment/procedure(s) were performed by non-physician practitioner and as supervising physician I was immediately available for consultation/collaboration.   EKG Interpretation None        Laquon Emel T Nobuo Nunziata, MD 10/06/13 0708 

## 2014-04-12 ENCOUNTER — Emergency Department (HOSPITAL_COMMUNITY)
Admission: EM | Admit: 2014-04-12 | Discharge: 2014-04-12 | Disposition: A | Discharge status: Home / Self Care | Payer: No Typology Code available for payment source | Attending: Emergency Medicine | Admitting: Emergency Medicine

## 2014-04-12 ENCOUNTER — Encounter (HOSPITAL_COMMUNITY): Payer: Self-pay | Admitting: Emergency Medicine

## 2014-04-12 DIAGNOSIS — Z3202 Encounter for pregnancy test, result negative: Secondary | ICD-10-CM | POA: Insufficient documentation

## 2014-04-12 DIAGNOSIS — H5712 Ocular pain, left eye: Secondary | ICD-10-CM

## 2014-04-12 DIAGNOSIS — Z86711 Personal history of pulmonary embolism: Secondary | ICD-10-CM | POA: Insufficient documentation

## 2014-04-12 DIAGNOSIS — N898 Other specified noninflammatory disorders of vagina: Secondary | ICD-10-CM | POA: Insufficient documentation

## 2014-04-12 LAB — WET PREP, GENITAL
Clue Cells Wet Prep HPF POC: NONE SEEN
Trich, Wet Prep: NONE SEEN
Yeast Wet Prep HPF POC: NONE SEEN

## 2014-04-12 LAB — PREGNANCY, URINE: Preg Test, Ur: NEGATIVE

## 2014-04-12 LAB — URINE MICROSCOPIC-ADD ON

## 2014-04-12 LAB — URINALYSIS, ROUTINE W REFLEX MICROSCOPIC
Bilirubin Urine: NEGATIVE
Glucose, UA: NEGATIVE mg/dL
Hgb urine dipstick: NEGATIVE
Ketones, ur: NEGATIVE mg/dL
Nitrite: NEGATIVE
Protein, ur: NEGATIVE mg/dL
Specific Gravity, Urine: 1.014 (ref 1.005–1.030)
Urobilinogen, UA: 1 mg/dL (ref 0.0–1.0)
pH: 7 (ref 5.0–8.0)

## 2014-04-12 MED ORDER — METRONIDAZOLE 500 MG PO TABS: 500.0000 mg | ORAL_TABLET | Freq: Two times a day (BID) | ORAL | Status: DC

## 2014-04-12 MED ORDER — CIPROFLOXACIN HCL 0.3 % OP SOLN
2.0000 [drp] | OPHTHALMIC | Status: DC
  Filled 2014-04-12: qty 2.5

## 2014-04-12 MED ORDER — TETRACAINE HCL 0.5 % OP SOLN
OPHTHALMIC | Status: AC
  Filled 2014-04-12: qty 2

## 2014-04-12 MED ORDER — FLUORESCEIN SODIUM 1 MG OP STRP
ORAL_STRIP | OPHTHALMIC | Status: AC
  Filled 2014-04-12: qty 1
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
## 2014-04-12 NOTE — ED Notes (Signed)
Pt reports some relief after eye irrigation, sts it's not as painful as it was, and light isn't bothering her anymore.

## 2014-04-12 NOTE — Discharge Instructions (Signed)
Call the ophthalmologist tomorrow to schedule a follow-up appointment. Take Tylenol or Motrin as needed for pain. Use antibiotics for 5 days.

## 2014-04-12 NOTE — ED Notes (Signed)
Pelvic is setup at bedside 

## 2014-04-13 ENCOUNTER — Encounter (HOSPITAL_COMMUNITY): Payer: Self-pay | Admitting: Emergency Medicine

## 2014-04-14 LAB — GC/CHLAMYDIA PROBE AMP
CT Probe RNA: NEGATIVE
GC Probe RNA: NEGATIVE

## 2015-09-20 ENCOUNTER — Encounter (HOSPITAL_COMMUNITY): Payer: Self-pay | Admitting: Emergency Medicine

## 2015-09-20 ENCOUNTER — Emergency Department (HOSPITAL_COMMUNITY)
Admission: EM | Admit: 2015-09-20 | Discharge: 2015-09-20 | Disposition: A | Discharge status: Home / Self Care | Payer: BC Managed Care – PPO | Attending: Emergency Medicine | Admitting: Emergency Medicine

## 2015-09-20 DIAGNOSIS — Z792 Long term (current) use of antibiotics: Secondary | ICD-10-CM | POA: Diagnosis not present

## 2015-09-20 DIAGNOSIS — Y998 Other external cause status: Secondary | ICD-10-CM | POA: Diagnosis not present

## 2015-09-20 DIAGNOSIS — Y9389 Activity, other specified: Secondary | ICD-10-CM | POA: Insufficient documentation

## 2015-09-20 DIAGNOSIS — Y92410 Unspecified street and highway as the place of occurrence of the external cause: Secondary | ICD-10-CM | POA: Insufficient documentation

## 2015-09-20 DIAGNOSIS — S3992XA Unspecified injury of lower back, initial encounter: Secondary | ICD-10-CM | POA: Insufficient documentation

## 2015-09-20 DIAGNOSIS — M545 Low back pain, unspecified: Secondary | ICD-10-CM

## 2015-09-20 DIAGNOSIS — Z86711 Personal history of pulmonary embolism: Secondary | ICD-10-CM | POA: Diagnosis not present

## 2015-09-20 MED ORDER — METHOCARBAMOL 500 MG PO TABS: 500.0000 mg | ORAL_TABLET | Freq: Two times a day (BID) | ORAL | Status: DC

## 2015-09-20 MED ORDER — NAPROXEN 500 MG PO TABS: 500.0000 mg | ORAL_TABLET | Freq: Two times a day (BID) | ORAL | Status: DC
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
## 2015-09-20 NOTE — Discharge Instructions (Signed)
Take medications as prescribed. Return to the emergency room for worsening condition or new concerning symptoms. Follow up with your regular doctor. If you don't have a regular doctor use one of the numbers below to establish a primary care doctor. ° ° °Emergency Department Resource Guide °1) Find a Doctor and Pay Out of Pocket °Although you won't have to find out who is covered by your insurance plan, it is a good idea to ask around and get recommendations. You will then need to call the office and see if the doctor you have chosen will accept you as a new patient and what types of options they offer for patients who are self-pay. Some doctors offer discounts or will set up payment plans for their patients who do not have insurance, but you will need to ask so you aren't surprised when you get to your appointment. ° °2) Contact Your Local Health Department °Not all health departments have doctors that can see patients for sick visits, but many do, so it is worth a call to see if yours does. If you don't know where your local health department is, you can check in your phone book. The CDC also has a tool to help you locate your state's health department, and many state websites also have listings of all of their local health departments. ° °3) Find a Walk-in Clinic °If your illness is not likely to be very severe or complicated, you may want to try a walk in clinic. These are popping up all over the country in pharmacies, drugstores, and shopping centers. They're usually staffed by nurse practitioners or physician assistants that have been trained to treat common illnesses and complaints. They're usually fairly quick and inexpensive. However, if you have serious medical issues or chronic medical problems, these are probably not your best option. ° °No Primary Care Doctor: °- Call [facility] at  832-8000 - they can help you locate a primary care doctor that  accepts your insurance, provides certain services,  etc. °- Physician Referral Service- 1-800-533-3463 ° °Emergency Department Resource Guide °1) Find a Doctor and Pay Out of Pocket °Although you won't have to find out who is covered by your insurance plan, it is a good idea to ask around and get recommendations. You will then need to call the office and see if the doctor you have chosen will accept you as a new patient and what types of options they offer for patients who are self-pay. Some doctors offer discounts or will set up payment plans for their patients who do not have insurance, but you will need to ask so you aren't surprised when you get to your appointment. ° °2) Contact Your Local Health Department °Not all health departments have doctors that can see patients for sick visits, but many do, so it is worth a call to see if yours does. If you don't know where your local health department is, you can check in your phone book. The CDC also has a tool to help you locate your state's health department, and many state websites also have listings of all of their local health departments. ° °3) Find a Walk-in Clinic °If your illness is not likely to be very severe or complicated, you may want to try a walk in clinic. These are popping up all over the country in pharmacies, drugstores, and shopping centers. They're usually staffed by nurse practitioners or physician assistants that have been trained to treat common illnesses and complaints. They're usually fairly   quick and inexpensive. However, if you have serious medical issues or chronic medical problems, these are probably not your best option. ° °No Primary Care Doctor: °- Call [facility] at  832-8000 - they can help you locate a primary care doctor that  accepts your insurance, provides certain services, etc. °- Physician Referral Service- 1-800-533-3463 ° °Chronic Pain Problems: °Organization         Address  Phone   Notes  °[facility]  ([phone]) [phone] Patients need to be referred by  their primary care doctor.  ° °Medication Assistance: °Organization         Address  Phone   Notes  °[place] [facility] [address] °[place], [place] [address] [phone]) 641-8030 --Must be a resident of [place] °-- Must have NO insurance coverage whatsoever (no Medicaid/ Medicare, etc.) °-- The pt. MUST have a primary care doctor that directs their care regularly and follows them in the community °  °[facility]  (866) 331-1348   °[employer]  (888) 892-1162   ° °Agencies that provide inexpensive medical care: °Organization         Address  Phone   Notes  °[facility]  ([phone]) [phone]   °[facility]    (336) 832-7272   °[facility] [address] °[place], [place] [address] (336) 832-4777   °[facility] [address], °[place] (336) [phone]   °[facility]    (336) 373-0678   °[facility]    (336) 272-1050   °[facility] ° [address], [place] Phone:  (336) 832-4444, Fax:  (336) [phone] Hours of Operation:  9 am - 6 pm, M-F.  Also accepts Medicaid/Medicare and self-pay.  °[facility] ° [address], [place] Phone: (336) [phone], Fax: ([phone]) [phone]. Hours of Operation:  8:30 am - 5:30 pm, M-F.  Also accepts Medicaid and self-pay.  °[facility] [place] [address], [place] Phone: (336) [phone]   °[facility] [address], [place], [place] (336)723-1848, Ext. 123 Mondays & Thursdays: 7-9 AM.  First 15 patients are seen on a first come, first serve basis. °  ° °Medicaid-accepting [place] Providers: ° °Organization         Address  Phone   Notes  °[facility] [address], [place] ([phone]) [phone] Also accepts self-pay patients.  °[facility] [address], [place] ° (336) [phone]   °[facility]  [address], [place] ([phone]) [phone]   °[facility] [address] [place] [address], [place] ([phone]) [phone]   °[facility] [address], [place]  ° ([phone]) [phone] Only accepts [place] Access Medicaid patients after they have their name applied to their card.  ° °Self-Pay (no insurance) in [place]: ° °Organization         Address  Phone   Notes  °Sickle Cell Patients, [facility] [address], [place] ([phone]) [phone]   °[facility] [address], [place] ([phone]) [phone]   °[facility] ° [address],  ([phone]) [phone]   °[facility]/Dr. Osei-Bonsu ° [address] [place] [address],  or [address], [place] [phone]) [phone] Phone number   for both [place] and [place] locations is the same.  °[facility] [address], [place] ([phone]) [phone]   °[facility] [address] [place] [address], [place] or [address] ([phone]) 852-7530 °(336) 878-2260   °[facility] [address], [place] (336) 350-1642, phone; (336) [phone], fax Sees patients 1st and 3rd Saturday of every month.  Must not qualify for public or private insurance (i.e. Medicaid, Medicare, [place] Health Choice, Veterans' Benefits) • Household income should be no more than 200% of the poverty level •The clinic cannot treat you if you are pregnant or think you are pregnant • Sexually transmitted diseases are not treated at the clinic.  ° ° ° °
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
## 2016-02-26 ENCOUNTER — Encounter (HOSPITAL_COMMUNITY): Payer: Self-pay | Admitting: *Deleted

## 2016-02-26 ENCOUNTER — Emergency Department (HOSPITAL_COMMUNITY): Payer: BC Managed Care – PPO

## 2016-02-26 ENCOUNTER — Emergency Department (HOSPITAL_COMMUNITY)
Admission: EM | Admit: 2016-02-26 | Discharge: 2016-02-26 | Disposition: A | Discharge status: Home / Self Care | Payer: BC Managed Care – PPO | Attending: Emergency Medicine | Admitting: Emergency Medicine

## 2016-02-26 DIAGNOSIS — Z79899 Other long term (current) drug therapy: Secondary | ICD-10-CM | POA: Insufficient documentation

## 2016-02-26 DIAGNOSIS — Y9389 Activity, other specified: Secondary | ICD-10-CM | POA: Diagnosis not present

## 2016-02-26 DIAGNOSIS — Y929 Unspecified place or not applicable: Secondary | ICD-10-CM | POA: Insufficient documentation

## 2016-02-26 DIAGNOSIS — M25532 Pain in left wrist: Secondary | ICD-10-CM | POA: Insufficient documentation

## 2016-02-26 DIAGNOSIS — W19XXXA Unspecified fall, initial encounter: Secondary | ICD-10-CM

## 2016-02-26 DIAGNOSIS — W1839XA Other fall on same level, initial encounter: Secondary | ICD-10-CM | POA: Diagnosis not present

## 2016-02-26 DIAGNOSIS — Y999 Unspecified external cause status: Secondary | ICD-10-CM | POA: Diagnosis not present

## 2016-02-26 MED ORDER — ACETAMINOPHEN 325 MG PO TABS
650.0000 mg | ORAL_TABLET | Freq: Once | ORAL | Status: AC
  Administered 2016-02-26: 650 mg via ORAL
  Filled 2016-02-26: qty 2
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
## 2016-02-26 NOTE — ED Provider Notes (Signed)
[facility]-EMERGENCY DEPT Provider Note   CSN: [id number] Arrival date & time: 02/26/16  2034     History   Chief Complaint Chief Complaint  Patient presents with  . Wrist Injury    HPI Susan Austin is a 30 y.o. female.  Susan Austin is a 30 y.o. female presents to ED with complaint of left wrist pain. Patient states she fell last night and landed on her left wrist. She did not have pain initially. However, this morning she started experiencing left ulnar wrist pain and associated swelling. She denies warmth, color change, fever, numbness, or weakness. Pain is worse with wrist movement particularly ulnar deviation. She has tried tylenol with some relief of symptoms. No other pain.       Past Medical History:  Diagnosis Date  . Pulmonary emboli [facility])     Patient Active Problem List   Diagnosis Date Noted  . Long term current use of anticoagulant 07/03/2010  . PE 09/26/2009    Past Surgical History:  Procedure Laterality Date  . CESAREAN SECTION      OB History    Gravida Para Term Preterm AB Living   5 2 2   2 2    SAB TAB Ectopic Multiple Live Births   2               Home Medications    Prior to Admission medications   Medication Sig Start Date End Date Taking? Authorizing Provider  ibuprofen (ADVIL,MOTRIN) 800 MG tablet Take 1 tablet (800 mg total) by mouth every 8 (eight) hours as needed for mild pain or moderate pain. 10/02/13   Trixie Dredge, PA-C  methocarbamol (ROBAXIN) 500 MG tablet Take 1 tablet (500 mg total) by mouth 2 (two) times daily. 09/20/15   Ace Gins Sam, PA-C  metroNIDAZOLE (FLAGYL) 500 MG tablet Take 1 tablet (500 mg total) by mouth 2 (two) times daily. 04/12/14   Lorre Nick, MD  naproxen (NAPROSYN) 500 MG tablet Take 1 tablet (500 mg total) by mouth 2 (two) times daily. 09/20/15   Carlene Coria, PA-C    Family History Family History  Problem Relation Age of Onset  . Alcohol abuse Neg Hx   . Arthritis Neg Hx   . Asthma Neg Hx     . Birth defects Neg Hx   . Cancer Neg Hx   . COPD Neg Hx   . Depression Neg Hx   . Diabetes Neg Hx   . Drug abuse Neg Hx   . Early death Neg Hx   . Hearing loss Neg Hx   . Heart disease Neg Hx   . Hyperlipidemia Neg Hx   . Hypertension Neg Hx   . Kidney disease Neg Hx   . Learning disabilities Neg Hx   . Mental illness Neg Hx   . Mental retardation Neg Hx   . Miscarriages / Stillbirths Neg Hx   . Stroke Neg Hx   . Vision loss Neg Hx     Social History Social History  Substance Use Topics  . Smoking status: Never Smoker  . Smokeless tobacco: Never Used  . Alcohol use No     Allergies   Review of patient's allergies indicates no known allergies.   Review of Systems Review of Systems  Constitutional: Negative for fever.  Respiratory: Negative for shortness of breath.   Cardiovascular: Negative for chest pain.  Musculoskeletal: Positive for arthralgias and joint swelling.  Skin: Negative for color change and wound.  Neurological:  Negative for weakness and numbness.     Physical Exam Updated Vital Signs BP 126/87   Pulse 88   Temp 98.5 F (36.9 C) (Oral)   Resp 18   LMP 02/08/2016   SpO2 97%   Physical Exam  Constitutional: She appears well-developed and well-nourished. No distress.  HENT:  Head: Normocephalic and atraumatic.  Eyes: Conjunctivae are normal. No scleral icterus.  Neck: Normal range of motion.  Cardiovascular: Normal rate and intact distal pulses.   Pulmonary/Chest: Effort normal. No respiratory distress.  Abdominal: She exhibits no distension.  Musculoskeletal:  No obvious deformity iof left wrist observed. Mild swelling appreciated. No warmth, redness, or erythema. TTP of ulnar aspect of left wrist. No TTP in anatomical snuffbox. ROM intact; pain with ulnar deviation. Strength and sensation are intact. 2+ radial pulses. Capillary refill <3 seconds.   Neurological: She is alert.  Skin: Skin is warm and dry. She is not diaphoretic.   Psychiatric: She has a normal mood and affect. Her behavior is normal.     ED Treatments / Results  Labs (all labs ordered are listed, but only abnormal results are displayed) Labs Reviewed - No data to display  EKG  EKG Interpretation None       Radiology Dg Wrist Complete Left  Result Date: 02/26/2016 CLINICAL DATA:  Acute onset of left ulnar wrist pain. Initial encounter. EXAM: LEFT WRIST - COMPLETE 3+ VIEW COMPARISON:  Left hand radiographs performed 10/02/2013 FINDINGS: There is no evidence of fracture or dislocation. The carpal rows are intact, and demonstrate normal alignment. The joint spaces are preserved. No significant soft tissue abnormalities are seen. IMPRESSION: No evidence of fracture or dislocation. Electronically Signed   By: Roanna RaiderJeffery  Chang M.D.   On: 02/26/2016 21:30    Procedures Procedures (including critical care time)  Medications Ordered in ED Medications  acetaminophen (TYLENOL) tablet 650 mg (650 mg Oral Given 02/26/16 2229)     Initial Impression / Assessment and Plan / ED Course  I have reviewed the triage vital signs and the nursing notes.  Pertinent labs & imaging results that were available during my care of the patient were reviewed by me and considered in my medical decision making (see chart for details).  Clinical Course  Value Comment By Time  DG Wrist Complete Left No obvious fracture or dislocation.  Lona Kettleshley Laurel Roark Rufo, [place]C 09/16 2200    Patient presents to ED complaint of left wrist pain. Patient is afebrile and non-toxic appearing in NAD. VSS. Physical exam remarkable for TTP of ulnar aspect of left wrist. ROM, strength, sensation intact. 2+ radial pulses. Capillary refill <3seconds. No obvious fracture on x-ray. Tylenol given in ED. Discussed results and plan with patient. Velcro splint applied. Discussed symptomatic management to include RICE. Follow up with PCP next week for re-evaluation. Return precautions given. Patient voiced  understanding and is agreeable.    Final Clinical Impressions(s) / ED Diagnoses   Final diagnoses:  Fall, initial encounter  Left wrist pain    New Prescriptions Discharge Medication List as of 02/26/2016 10:25 PM       Lona KettleAshley Laurel Alvan Culpepper, PA-C 02/26/16 2357    Arby BarretteMarcy Pfeiffer, MD 02/29/16 Ernestina [id number]

## 2016-02-26 NOTE — Discharge Instructions (Signed)
Read the information below.   Your x-rays were negative for an obvious fracture or dislocation.  You are being placed in a splint. For the next 48 hours ice your wrist for 20 minute increments. You can take tylenol 650mg  or motrin 400mg  every 6 hrs for pain and swelling. Wear the splint for added support and stability.  Follow up with your primary care provider early next week for re-evaluation.  You may return to the Emergency Department at any time for worsening condition or any new symptoms that concern you. Return to the ED if you develop numbness, weakness, or change in color in your hands/fingers.

## 2016-04-21 ENCOUNTER — Encounter (HOSPITAL_COMMUNITY): Payer: Self-pay | Admitting: Emergency Medicine

## 2016-04-21 ENCOUNTER — Emergency Department (HOSPITAL_COMMUNITY)
Admission: EM | Admit: 2016-04-21 | Discharge: 2016-04-21 | Disposition: A | Discharge status: Home / Self Care | Payer: BC Managed Care – PPO | Attending: Emergency Medicine | Admitting: Emergency Medicine

## 2016-04-21 ENCOUNTER — Emergency Department (HOSPITAL_COMMUNITY): Payer: BC Managed Care – PPO

## 2016-04-21 DIAGNOSIS — J40 Bronchitis, not specified as acute or chronic: Secondary | ICD-10-CM | POA: Insufficient documentation

## 2016-04-21 DIAGNOSIS — R05 Cough: Secondary | ICD-10-CM | POA: Diagnosis present

## 2016-04-21 LAB — CBC
HCT: 37.2 % (ref 36.0–46.0)
Hemoglobin: 13 g/dL (ref 12.0–15.0)
MCH: 29 pg (ref 26.0–34.0)
MCHC: 34.9 g/dL (ref 30.0–36.0)
MCV: 83 fL (ref 78.0–100.0)
Platelets: 272 10*3/uL (ref 150–400)
RBC: 4.48 MIL/uL (ref 3.87–5.11)
RDW: 12.3 % (ref 11.5–15.5)
WBC: 6.9 10*3/uL (ref 4.0–10.5)

## 2016-04-21 LAB — BASIC METABOLIC PANEL
Anion gap: 7 (ref 5–15)
BUN: 7 mg/dL (ref 6–20)
CO2: 24 mmol/L (ref 22–32)
Calcium: 9.1 mg/dL (ref 8.9–10.3)
Chloride: 108 mmol/L (ref 101–111)
Creatinine, Ser: 0.81 mg/dL (ref 0.44–1.00)
GFR calc Af Amer: >60 mL/min (ref >60)
GFR calc non Af Amer: >60 mL/min (ref >60)
Glucose, Bld: 91 mg/dL (ref 65–99)
Potassium: 3.7 mmol/L (ref 3.5–5.1)
Sodium: 139 mmol/L (ref 135–145)

## 2016-04-21 LAB — D-DIMER, QUANTITATIVE: D-Dimer, Quant: 0.4 ug/mL-FEU (ref 0.00–0.50)

## 2016-04-21 MED ORDER — BENZONATATE 100 MG PO CAPS: 100.0000 mg | ORAL_CAPSULE | Freq: Three times a day (TID) | ORAL | 0 refills | Status: DC

## 2016-04-21 MED ORDER — BENZONATATE 100 MG PO CAPS
100.0000 mg | ORAL_CAPSULE | Freq: Once | ORAL | Status: AC
  Administered 2016-04-21: 100 mg via ORAL
  Filled 2016-04-21: qty 1

## 2016-04-21 NOTE — Progress Notes (Signed)
Patient listed as having [employer] insurance without a pcp.  EDCM spoke to patient at bedside.  [facility]  System updated.

## 2016-04-21 NOTE — ED Provider Notes (Signed)
[facility]-EMERGENCY DEPT Provider Note   CSN: [id number] Arrival date & time: 04/21/16  2051     History   Chief Complaint Chief Complaint  Patient presents with  . Shortness of Breath    HPI Susan Austin is a 30 y.o. female.  The history is provided by the patient.  Shortness of Breath  This is a new problem. The current episode started yesterday. The problem has not changed since onset.Associated symptoms include rhinorrhea and cough. Pertinent negatives include no fever, no vomiting and no leg swelling. Associated symptoms comments: mylagias. Treatments tried: tylenol. The treatment provided no relief. Associated medical issues include PE and DVT. Associated medical issues comments: DVT felt to be related to OCP.    Past Medical History:  Diagnosis Date  . Pulmonary emboli [facility])     Patient Active Problem List   Diagnosis Date Noted  . Long term current use of anticoagulant 07/03/2010  . PE 09/26/2009    Past Surgical History:  Procedure Laterality Date  . CESAREAN SECTION      OB History    Gravida Para Term Preterm AB Living   5 2 2   2 2    SAB TAB Ectopic Multiple Live Births   2               Home Medications    Prior to Admission medications   Medication Sig Start Date End Date Taking? Authorizing Provider  benzonatate (TESSALON) 100 MG capsule Take 1 capsule (100 mg total) by mouth every 8 (eight) hours. 04/21/16   Linwood DibblesJon Donovan Gatchel, MD    Family History Family History  Problem Relation Age of Onset  . Alcohol abuse Neg Hx   . Arthritis Neg Hx   . Asthma Neg Hx   . Birth defects Neg Hx   . Cancer Neg Hx   . COPD Neg Hx   . Depression Neg Hx   . Diabetes Neg Hx   . Drug abuse Neg Hx   . Early death Neg Hx   . Hearing loss Neg Hx   . Heart disease Neg Hx   . Hyperlipidemia Neg Hx   . Hypertension Neg Hx   . Kidney disease Neg Hx   . Learning disabilities Neg Hx   . Mental illness Neg Hx   . Mental retardation Neg Hx   . Miscarriages /  Stillbirths Neg Hx   . Stroke Neg Hx   . Vision loss Neg Hx     Social History Social History  Substance Use Topics  . Smoking status: Never Smoker  . Smokeless tobacco: Never Used  . Alcohol use No     Allergies   Patient has no known allergies.   Review of Systems Review of Systems  Constitutional: Negative for fever.  HENT: Positive for rhinorrhea.   Respiratory: Positive for cough and shortness of breath.   Cardiovascular: Negative for leg swelling.  Gastrointestinal: Negative for vomiting.  All other systems reviewed and are negative.    Physical Exam Updated Vital Signs BP 121/90 (BP Location: Right Arm)   Pulse 93   Temp 98.6 F (37 C) (Oral)   Ht 5\' 4"  (1.626 m)   Wt 83.9 kg   LMP 04/07/2016   SpO2 95%   BMI 31.76 kg/m   Physical Exam  Constitutional: She appears well-developed and well-nourished. No distress.  HENT:  Head: Normocephalic and atraumatic.  Right Ear: External ear normal.  Left Ear: External ear normal.  Eyes: Conjunctivae are normal.  Right eye exhibits no discharge. Left eye exhibits no discharge. No scleral icterus.  Neck: Neck supple. No tracheal deviation present.  Cardiovascular: Normal rate, regular rhythm and intact distal pulses.   Pulmonary/Chest: Effort normal and breath sounds normal. No stridor. No respiratory distress. She has no wheezes. She has no rales.  Frequent coughing  Abdominal: Soft. Bowel sounds are normal. She exhibits no distension. There is no tenderness. There is no rebound and no guarding.  Musculoskeletal: She exhibits no edema or tenderness.  Neurological: She is alert. She has normal strength. No cranial nerve deficit (no facial droop, extraocular movements intact, no slurred speech) or sensory deficit. She exhibits normal muscle tone. She displays no seizure activity. Coordination normal.  Skin: Skin is warm and dry. No rash noted.  Psychiatric: She has a normal mood and affect.  Nursing note and vitals  reviewed.    ED Treatments / Results  Labs (all labs ordered are listed, but only abnormal results are displayed) Labs Reviewed  CBC  BASIC METABOLIC PANEL  D-DIMER, QUANTITATIVE ([facility])    EKG  EKG Interpretation  Date/Time:  Friday April 21 2016 21:04:52 EST Ventricular Rate:  103 PR Interval:    QRS Duration: 96 QT Interval:  337 QTC Calculation: 442 R Axis:   64 Text Interpretation:  Sinus tachycardia Baseline wander in lead(s) V1 V2 V3 V4 Since last tracing rate faster Confirmed by Lael Pilch  MD-J, Legaci Tarman ([id number]) on 04/21/2016 9:21:10 PM       Radiology Dg Chest 2 View  Result Date: 04/21/2016 CLINICAL DATA:  Shortness of breath, cough, and body aches starting yesterday. Previous history of blood clots in the legs and lungs. EXAM: CHEST  2 VIEW COMPARISON:  07/14/2013 FINDINGS: Normal heart size and pulmonary vascularity. No focal airspace disease or consolidation in the lungs. No blunting of costophrenic angles. No pneumothorax. Mediastinal contours appear intact. Metallic nipple piercings. IMPRESSION: No active cardiopulmonary disease. Electronically Signed   By: Burman NievesWilliam  Stevens M.D.   On: 04/21/2016 22:32    Procedures Procedures (including critical care time)  Medications Ordered in ED Medications  benzonatate (TESSALON) capsule 100 mg (100 mg Oral Given 04/21/16 2208)     Initial Impression / Assessment and Plan / ED Course  I have reviewed the triage vital signs and the nursing notes.  Pertinent labs & imaging results that were available during my care of the patient were reviewed by me and considered in my medical decision making (see chart for details).  Clinical Course      No pna.  D dimer negative.  Doubt PE.  Sx consistent with URI.  OTC meds and tessalon for cough.  Final Clinical Impressions(s) / ED Diagnoses   Final diagnoses:  Bronchitis    New Prescriptions New Prescriptions   BENZONATATE (TESSALON) 100 MG CAPSULE    Take 1  capsule (100 mg total) by mouth every 8 (eight) hours.     Linwood DibblesJon Allaya Abbasi, MD 04/21/16 [phone]

## 2016-04-21 NOTE — Discharge Instructions (Signed)
Follow-up with a primary care doctor if the symptoms are not improving in the next week, take over-the-counter Sudafed to help with the congestion. You can take Tylenol or Advil as needed for aches and pains

## 2016-04-21 NOTE — ED Triage Notes (Signed)
Pt reports having shortness of breath and body aches that started yesterday. Pt reports prior hx of blood clots legs and lungs.

## 2017-02-26 ENCOUNTER — Emergency Department (HOSPITAL_COMMUNITY)
Admission: EM | Admit: 2017-02-26 | Discharge: 2017-02-26 | Discharge status: Absent without leave | Payer: BC Managed Care – PPO

## 2017-05-25 ENCOUNTER — Emergency Department (HOSPITAL_COMMUNITY)
Admission: EM | Admit: 2017-05-25 | Discharge: 2017-05-26 | Disposition: A | Discharge status: Home / Self Care | Payer: Medicaid Other | Attending: Emergency Medicine | Admitting: Emergency Medicine

## 2017-05-25 ENCOUNTER — Encounter (HOSPITAL_COMMUNITY): Payer: Self-pay

## 2017-05-25 ENCOUNTER — Other Ambulatory Visit: Payer: Self-pay

## 2017-05-25 ENCOUNTER — Emergency Department (HOSPITAL_COMMUNITY): Payer: Medicaid Other

## 2017-05-25 DIAGNOSIS — R0781 Pleurodynia: Secondary | ICD-10-CM | POA: Insufficient documentation

## 2017-05-25 DIAGNOSIS — R0789 Other chest pain: Secondary | ICD-10-CM | POA: Diagnosis present

## 2017-05-25 HISTORY — DX: Deep phlebothrombosis in pregnancy, unspecified trimester: O22.30

## 2017-05-25 LAB — BASIC METABOLIC PANEL
Anion gap: 10 (ref 5–15)
BUN: 8 mg/dL (ref 6–20)
CO2: 23 mmol/L (ref 22–32)
Calcium: 9.4 mg/dL (ref 8.9–10.3)
Chloride: 104 mmol/L (ref 101–111)
Creatinine, Ser: 0.77 mg/dL (ref 0.44–1.00)
GFR calc Af Amer: >60 mL/min (ref >60)
GFR calc non Af Amer: >60 mL/min (ref >60)
Glucose, Bld: 102 mg/dL — ABNORMAL HIGH (ref 65–99)
Potassium: 3.4 mmol/L — ABNORMAL LOW (ref 3.5–5.1)
Sodium: 137 mmol/L (ref 135–145)

## 2017-05-25 LAB — CBC
HCT: 38.4 % (ref 36.0–46.0)
Hemoglobin: 13.4 g/dL (ref 12.0–15.0)
MCH: 29.2 pg (ref 26.0–34.0)
MCHC: 34.9 g/dL (ref 30.0–36.0)
MCV: 83.7 fL (ref 78.0–100.0)
Platelets: 317 10*3/uL (ref 150–400)
RBC: 4.59 MIL/uL (ref 3.87–5.11)
RDW: 12.6 % (ref 11.5–15.5)
WBC: 8.1 10*3/uL (ref 4.0–10.5)

## 2017-05-25 LAB — I-STAT BETA HCG BLOOD, ED (MC, WL, AP ONLY): I-stat hCG, quantitative: <5 m[IU]/mL (ref <5)

## 2017-05-25 LAB — I-STAT TROPONIN, ED: Troponin i, poc: 0 ng/mL (ref 0.00–0.08)

## 2017-05-25 NOTE — ED Provider Notes (Signed)
[facility]-EMERGENCY DEPT Provider Note   CSN: [id number] Arrival date & time: 05/25/17  1709    History   Chief Complaint Chief Complaint  Patient presents with  . Chest Pain    HPI Susan Austin is a 31 y.o. female.  31 year old female with a history of PE in 2011 secondary to birth control use presents to the emergency department for chest pain.  She reports mid substernal chest pain which has been sharp and pleuritic, worse with inspiration.  She states that pain has been constant since yesterday and unrelieved with ibuprofen.  She is concerned because her if symptoms feel similar to when she was diagnosed with a blood clot.  She denies any recent surgeries, hospitalizations, travel.  No associated leg swelling, hemoptysis, fever, syncope.      Past Medical History:  Diagnosis Date  . DVT (deep vein thrombosis) in pregnancy (HCC)   . Pulmonary emboli [facility](HCC)     Patient Active Problem List   Diagnosis Date Noted  . Long term current use of anticoagulant 07/03/2010  . PE 09/26/2009    Past Surgical History:  Procedure Laterality Date  . CESAREAN SECTION      OB History    Gravida Para Term Preterm AB Living   5 2 2   2 2    SAB TAB Ectopic Multiple Live Births   2               Home Medications    Prior to Admission medications   Medication Sig Start Date End Date Taking? Authorizing Provider  ibuprofen (ADVIL,MOTRIN) 200 MG tablet Take 400 mg by mouth every 6 (six) hours as needed for headache, mild pain or moderate pain.   Yes [provider]    Family History Family History  Problem Relation Age of Onset  . Alcohol abuse Neg Hx   . Arthritis Neg Hx   . Asthma Neg Hx   . Birth defects Neg Hx   . Cancer Neg Hx   . COPD Neg Hx   . Depression Neg Hx   . Diabetes Neg Hx   . Drug abuse Neg Hx   . Early death Neg Hx   . Hearing loss Neg Hx   . Heart disease Neg Hx   . Hyperlipidemia Neg Hx   . Hypertension Neg  Hx   . Kidney disease Neg Hx   . Learning disabilities Neg Hx   . Mental illness Neg Hx   . Mental retardation Neg Hx   . Miscarriages / Stillbirths Neg Hx   . Stroke Neg Hx   . Vision loss Neg Hx     Social History Social History   Tobacco Use  . Smoking status: Never Smoker  . Smokeless tobacco: Never Used  Substance Use Topics  . Alcohol use: No  . Drug use: No     Allergies   Patient has no known allergies.   Review of Systems Review of Systems Ten systems reviewed and are negative for acute change, except as noted in the HPI.    Physical Exam Updated Vital Signs BP 117/63 (BP Location: Right Arm)   Pulse 81   Temp 98.3 F (36.8 C) (Oral)   Resp 20   Ht 5\' 3"  (1.6 m)   Wt 88.5 kg (195 lb)   LMP 05/25/2017   SpO2 100%   BMI 34.54 kg/m   Physical Exam  Constitutional: She is oriented to person, place, and time. She appears  well-developed and well-nourished. No distress.  Nontoxic appearing and in NAD  HENT:  Head: Normocephalic and atraumatic.  Eyes: Conjunctivae and EOM are normal. No scleral icterus.  Neck: Normal range of motion.  Cardiovascular: Normal rate, regular rhythm and intact distal pulses.  Patient not tachycardic as noted in triage  Pulmonary/Chest: Effort normal. No stridor. No respiratory distress. She has no wheezes.  Respirations even and unlabored.  Lungs clear to auscultation.  Musculoskeletal: Normal range of motion.  Neurological: She is alert and oriented to person, place, and time. She exhibits normal muscle tone. Coordination normal.  Skin: Skin is warm and dry. No rash noted. She is not diaphoretic. No erythema. No pallor.  Psychiatric: She has a normal mood and affect. Her behavior is normal.  Nursing note and vitals reviewed.    ED Treatments / Results  Labs (all labs ordered are listed, but only abnormal results are displayed) Labs Reviewed  BASIC METABOLIC PANEL - Abnormal; Notable for the following components:       Result Value   Potassium 3.4 (*)    Glucose, Bld 102 (*)    All other components within normal limits  CBC  I-STAT TROPONIN, ED  I-STAT BETA HCG BLOOD, ED ([facility], [facility], [facility] ONLY)    EKG  EKG Interpretation None       Radiology Dg Chest 2 View  Result Date: 05/25/2017 CLINICAL DATA:  Central chest pain, onset yesterday. EXAM: CHEST  2 VIEW COMPARISON:  Radiographs 04/21/2016 FINDINGS: The cardiomediastinal contours are normal. The lungs are clear. Pulmonary vasculature is normal. No consolidation, pleural effusion, or pneumothorax. No acute osseous abnormalities are seen. IMPRESSION: No acute pulmonary process. Electronically Signed   By: Rubye OaksMelanie  Ehinger M.D.   On: 05/25/2017 18:01   Ct Angio Chest Pe W And/or Wo Contrast  Result Date: 05/26/2017 CLINICAL DATA:  Acute onset of mid chest pain. Personal history of deep venous thrombosis and pulmonary embolus. EXAM: CT ANGIOGRAPHY CHEST WITH CONTRAST TECHNIQUE: Multidetector CT imaging of the chest was performed using the standard protocol during bolus administration of intravenous contrast. Multiplanar CT image reconstructions and MIPs were obtained to evaluate the vascular anatomy. CONTRAST:  100mL ISOVUE-370 IOPAMIDOL (ISOVUE-370) INJECTION 76% COMPARISON:  Chest radiograph performed 05/25/2017, and CTA of the chest performed 09/11/2013 FINDINGS: Cardiovascular:  There is no evidence of pulmonary embolus. The heart is normal in size. The thoracic aorta is unremarkable in appearance. The great vessels are grossly unremarkable. Mediastinum/Nodes: The mediastinum is unremarkable in appearance. No mediastinal lymphadenopathy is seen. No pericardial effusion is identified. Residual thymic tissue is within normal limits. The visualized portions of the thyroid gland are unremarkable. No axillary lymphadenopathy is seen. Lungs/Pleura: Mild bibasilar atelectasis is noted. No pleural effusion or pneumothorax is seen. No masses are identified. Upper Abdomen:  The visualized portions of the liver and spleen are unremarkable. The visualized portions of the gallbladder, pancreas, adrenal glands, and kidneys are within normal limits. Musculoskeletal: No acute osseous abnormalities are identified. The visualized musculature is unremarkable in appearance. Review of the MIP images confirms the above findings. IMPRESSION: 1. No evidence of pulmonary embolus. 2. Mild bibasilar atelectasis noted.  Lungs otherwise clear. Electronically Signed   By: Roanna RaiderJeffery  Chang M.D.   On: 05/26/2017 04:06    Procedures Procedures (including critical care time)  Medications Ordered in ED Medications  iopamidol (ISOVUE-370) 76 % injection (not administered)  iopamidol (ISOVUE-370) 76 % injection 100 mL (100 mLs Intravenous Contrast Given 05/26/17 0220)     Initial Impression /  Assessment and Plan / ED Course  I have reviewed the triage vital signs and the nursing notes.  Pertinent labs & imaging results that were available during my care of the patient were reviewed by me and considered in my medical decision making (see chart for details).      31 year old female presents to the emergency department for pleuritic chest pain.  She has a history of PE in 2011.  Patient borderline tachycardic on arrival, but with good oxygen saturations.  Given history a CTA was performed to rule out pulmonary embolus.  This imaging today was negative.  Low suspicion for emergent cause of symptoms.  Cardiac workup with negative troponin in the setting of constant pain since yesterday.  There is no evidence of pneumothorax, pleural effusion, vascular congestion, pneumonia.  Heart size is normal.  I have discussed outpatient management with anti-inflammatories.  Patient verbalizes understanding.  She has been encouraged to follow-up with a primary care doctor if symptoms persist.  Return precautions discussed and provided.  Patient discharged in stable condition with no unaddressed  concerns.   Vitals:   05/25/17 1736 05/25/17 2227 05/25/17 2254 05/26/17 0121  BP: 124/87 (!) 133/92  117/63  Pulse: 98 82  81  Resp: (!) 23 20  20   Temp: 99.1 F (37.3 C)   98.3 F (36.8 C)  TempSrc: Oral   Oral  SpO2: 96% 97% 96% 100%  Weight: 88.5 kg (195 lb)     Height: 5\' 3"  (1.6 m)       Final Clinical Impressions(s) / ED Diagnoses   Final diagnoses:  Pleuritic chest pain    ED Discharge Orders    None       Antony Madura, PA-C 05/26/17 0423    Nira Conn, MD 05/27/17 [phone]

## 2017-05-25 NOTE — ED Triage Notes (Signed)
Patient c/o mid chest pain and increased pain when she tries to inhale since yesterday. Patient states a history of PE and DVT.

## 2017-05-26 ENCOUNTER — Encounter (HOSPITAL_COMMUNITY): Payer: Self-pay | Admitting: Radiology

## 2017-05-26 ENCOUNTER — Emergency Department (HOSPITAL_COMMUNITY): Payer: Medicaid Other

## 2017-05-26 MED ORDER — IOPAMIDOL (ISOVUE-370) INJECTION 76%
100.0000 mL | Freq: Once | INTRAVENOUS | Status: AC | PRN
  Administered 2017-05-26: 100 mL via INTRAVENOUS

## 2017-05-26 MED ORDER — IOPAMIDOL (ISOVUE-370) INJECTION 76%
INTRAVENOUS | Status: AC
  Filled 2017-05-26: qty 100

## 2017-05-26 NOTE — Discharge Instructions (Signed)
Your CT today did not show any evidence of a blood clot in your lungs.  It is possible that your pain is due to a pulled muscle or inflammation between your ribs.  This can be managed with Tylenol, ibuprofen, or Aleve.  These medications can be purchased over-the-counter.  Take as prescribed on the bottle.  Follow-up with a primary care doctor as needed for persistent symptoms.  You may return to the emergency department as needed if symptoms worsen.

## 2018-02-25 ENCOUNTER — Other Ambulatory Visit: Payer: Self-pay

## 2018-02-25 ENCOUNTER — Encounter (HOSPITAL_BASED_OUTPATIENT_CLINIC_OR_DEPARTMENT_OTHER): Payer: Self-pay

## 2018-02-25 ENCOUNTER — Emergency Department (HOSPITAL_BASED_OUTPATIENT_CLINIC_OR_DEPARTMENT_OTHER)
Admission: EM | Admit: 2018-02-25 | Discharge: 2018-02-26 | Disposition: A | Discharge status: Home / Self Care | Payer: Medicaid Other | Attending: Emergency Medicine | Admitting: Emergency Medicine

## 2018-02-25 DIAGNOSIS — Y999 Unspecified external cause status: Secondary | ICD-10-CM | POA: Insufficient documentation

## 2018-02-25 DIAGNOSIS — Z86711 Personal history of pulmonary embolism: Secondary | ICD-10-CM | POA: Diagnosis not present

## 2018-02-25 DIAGNOSIS — Y9383 Activity, rough housing and horseplay: Secondary | ICD-10-CM | POA: Diagnosis not present

## 2018-02-25 DIAGNOSIS — W500XXA Accidental hit or strike by another person, initial encounter: Secondary | ICD-10-CM | POA: Insufficient documentation

## 2018-02-25 DIAGNOSIS — S032XXA Dislocation of tooth, initial encounter: Secondary | ICD-10-CM | POA: Insufficient documentation

## 2018-02-25 DIAGNOSIS — Z86718 Personal history of other venous thrombosis and embolism: Secondary | ICD-10-CM | POA: Insufficient documentation

## 2018-02-25 DIAGNOSIS — Y929 Unspecified place or not applicable: Secondary | ICD-10-CM | POA: Insufficient documentation

## 2018-02-25 DIAGNOSIS — Y939 Activity, unspecified: Secondary | ICD-10-CM | POA: Diagnosis not present

## 2018-02-25 DIAGNOSIS — K0889 Other specified disorders of teeth and supporting structures: Secondary | ICD-10-CM

## 2018-02-25 NOTE — ED Triage Notes (Signed)
Pt reports she was playing around" and was kneed in front teeth-NAD-steady gait

## 2018-02-26 MED ORDER — CHLORHEXIDINE GLUCONATE 0.12 % MT SOLN: 15.0000 mL | Freq: Two times a day (BID) | OROMUCOSAL | 0 refills | Status: DC

## 2018-02-26 NOTE — ED Notes (Signed)
Pt discharged to home with family. NAD.  

## 2018-02-26 NOTE — ED Provider Notes (Signed)
[facility] EMERGENCY DEPARTMENT Provider Note  CSN: [id number] Arrival date & time: 02/25/18 2252  Chief Complaint(s) Mouth Injury  HPI Susan Austin is a 32 y.o. female who presents to the emergency department for dental injury.  She reports that she was roughhousing with some friends and was need in the mouth.  She is endorsing upper central incisor pain with gingival bleeding that is not controlled.  Pain exacerbated with palpation of the teeth.  Aching in nature.  No alleviating factors.  She denies any other trauma or injuries.  Denies any headache or facial pain.  No visual disturbance.  No neck pain.  HPI  Past Medical History Past Medical History:  Diagnosis Date  . DVT (deep vein thrombosis) in pregnancy (HCC)   . Pulmonary emboli [facility])    Patient Active Problem List   Diagnosis Date Noted  . Long term current use of anticoagulant 07/03/2010  . PE 09/26/2009   Home Medication(s) Prior to Admission medications   Medication Sig Start Date End Date Taking? Authorizing Provider  ibuprofen (ADVIL,MOTRIN) 200 MG tablet Take 400 mg by mouth every 6 (six) hours as needed for headache, mild pain or moderate pain.   Yes [provider]  chlorhexidine (PERIDEX) 0.12 % solution Use as directed 15 mLs in the mouth or throat 2 (two) times daily. 02/26/18   Nira Conn, MD                                                                                                                                    Past Surgical History Past Surgical History:  Procedure Laterality Date  . CESAREAN SECTION     Family History Family History  Problem Relation Age of Onset  . Alcohol abuse Neg Hx   . Arthritis Neg Hx   . Asthma Neg Hx   . Birth defects Neg Hx   . Cancer Neg Hx   . COPD Neg Hx   . Depression Neg Hx   . Diabetes Neg Hx   . Drug abuse Neg Hx   . Early death Neg Hx   . Hearing loss Neg Hx   . Heart disease Neg Hx   . Hyperlipidemia Neg Hx     . Hypertension Neg Hx   . Kidney disease Neg Hx   . Learning disabilities Neg Hx   . Mental illness Neg Hx   . Mental retardation Neg Hx   . Miscarriages / Stillbirths Neg Hx   . Stroke Neg Hx   . Vision loss Neg Hx     Social History Social History   Tobacco Use  . Smoking status: Never Smoker  . Smokeless tobacco: Never Used  Substance Use Topics  . Alcohol use: No  . Drug use: No   Allergies Patient has no known allergies.  Review of Systems Review of Systems As noted in HPI Physical Exam Vital Signs  I  have reviewed the triage vital signs BP 132/78   Pulse 83   Temp 99 F (37.2 C) (Oral)   Resp 18   Ht 5\' 3"  (1.6 m)   Wt 89.9 kg   LMP 02/10/2018   SpO2 100%   BMI 35.11 kg/m   Physical Exam  Constitutional: She is oriented to person, place, and time. She appears well-developed and well-nourished. No distress.  HENT:  Head: Normocephalic and atraumatic.  Right Ear: External ear normal.  Left Ear: External ear normal.  Nose: Nose normal.  Mild subluxation of both central incisors  Eyes: Conjunctivae and EOM are normal. No scleral icterus.  Neck: Normal range of motion and phonation normal. No spinous process tenderness and no muscular tenderness present.  Cardiovascular: Normal rate and regular rhythm.  Pulmonary/Chest: Effort normal. No stridor. No respiratory distress.  Abdominal: She exhibits no distension.  Musculoskeletal: Normal range of motion. She exhibits no edema.  Neurological: She is alert and oriented to person, place, and time.  Skin: She is not diaphoretic.  Psychiatric: She has a normal mood and affect. Her behavior is normal.  Vitals reviewed.   ED Results and Treatments Labs (all labs ordered are listed, but only abnormal results are displayed) Labs Reviewed - No data to display                                                                                                                       EKG  EKG  Interpretation  Date/Time:    Ventricular Rate:    PR Interval:    QRS Duration:   QT Interval:    QTC Calculation:   R Axis:     Text Interpretation:        Radiology No results found. Pertinent labs & imaging results that were available during my care of the patient were reviewed by me and considered in my medical decision making (see chart for details).  Medications Ordered in ED Medications - No data to display                                                                                                                                  Procedures Procedures SPLINT APPLICATION Authorized by: Amadeo GarnetPedro Eduardo Cardama Consent: Verbal consent obtained. Risks and benefits: risks, benefits and alternatives were discussed Consent given by: patient Splint applied by: myself Location details: central incisors Splint type: anterior and posterior  Supplies used:  copak Post-procedure: The splinted body part was neurovascularly unchanged following the procedure. Patient tolerance: Patient tolerated the procedure well with no immediate complications.   (including critical care time)  Medical Decision Making / ED Course I have reviewed the nursing notes for this encounter and the patient's prior records (if available in EHR or on provided paperwork).    Bilateral upper central incisor mild subluxation.  Stabilized with cold pack.  No other injuries noted on exam requiring imaging at this time.  Recommended close follow-up with oral surgeon/dentistry.  Prescription for Peridex mouthwash given.  Over-the-counter pain medicine recommended.  The patient appears reasonably screened and/or stabilized for discharge and I doubt any other medical condition or other [facility] requiring further screening, evaluation, or treatment in the ED at this time prior to discharge.  The patient is safe for discharge with strict return precautions.   Final Clinical Impression(s) / ED Diagnoses Final  diagnoses:  Subluxation of tooth   Disposition: Discharge  Condition: Good  I have discussed the results, Dx and Tx plan with the patient who expressed understanding and agree(s) with the plan. Discharge instructions discussed at great length. The patient was given strict return precautions who verbalized understanding of the instructions. No further questions at time of discharge.    ED Discharge Orders         Ordered    chlorhexidine (PERIDEX) 0.12 % solution  2 times daily     02/26/18 0232            This chart was dictated using voice recognition software.  Despite best efforts to proofread,  errors can occur which can change the documentation meaning.   Nira Conn, MD 02/26/18 [phone]

## 2018-02-26 NOTE — Discharge Instructions (Addendum)
You may take acetaminophen (Tylenol) 1000 mg every 8 hours and/or ibuprofen (Motrin or Advil) 600 mg every 6 hours as needed for pain.

## 2018-05-06 ENCOUNTER — Other Ambulatory Visit: Payer: Self-pay

## 2018-05-07 ENCOUNTER — Encounter: Payer: Self-pay | Admitting: Sports Medicine

## 2018-05-07 ENCOUNTER — Ambulatory Visit: Payer: Medicaid Other | Admitting: Sports Medicine

## 2018-05-07 ENCOUNTER — Ambulatory Visit (INDEPENDENT_AMBULATORY_CARE_PROVIDER_SITE_OTHER): Payer: Medicaid Other

## 2018-05-07 ENCOUNTER — Other Ambulatory Visit: Payer: Self-pay | Admitting: Sports Medicine

## 2018-05-07 VITALS — BP 124/76 | HR 73

## 2018-05-07 DIAGNOSIS — M21619 Bunion of unspecified foot: Secondary | ICD-10-CM

## 2018-05-07 DIAGNOSIS — M216X1 Other acquired deformities of right foot: Secondary | ICD-10-CM | POA: Diagnosis not present

## 2018-05-07 DIAGNOSIS — M21611 Bunion of right foot: Secondary | ICD-10-CM | POA: Diagnosis not present

## 2018-05-07 DIAGNOSIS — M79672 Pain in left foot: Secondary | ICD-10-CM

## 2018-05-07 DIAGNOSIS — M722 Plantar fascial fibromatosis: Secondary | ICD-10-CM

## 2018-05-07 DIAGNOSIS — M79671 Pain in right foot: Secondary | ICD-10-CM

## 2018-05-07 NOTE — Progress Notes (Signed)
Subjective: Susan BuntingChristine L Forton is a 32 y.o. female patient who presents to office for evaluation of Right> Left bunion pain 1 year. Patient complains of progressive pain especially over the last year in the Right>Left foot that starts as pain over the bump with direct pressure achy in nature and pain worsens with range of motion; patient now has difficulty fitting shoes comfortably. Ranks pain 4/10 and is now interferring with daily activities.  Patient has not tried any treatment.  Patient admits to a family history of bunions with her aunt having a bunion.  Patient denies any other pedal complaints.   Review of Systems  All other systems reviewed and are negative.   Patient Active Problem List   Diagnosis Date Noted  . Long term current use of anticoagulant 07/03/2010  . PE 09/26/2009    Current Outpatient Medications on File Prior to Visit  Medication Sig Dispense Refill  . phentermine 30 MG capsule Take by mouth.     No current facility-administered medications on file prior to visit.     No Known Allergies  Objective:  General: Alert and oriented x3 in no acute distress  Dermatology: No open lesions bilateral lower extremities, no webspace macerations, no ecchymosis bilateral, all nails x 10 are well manicured.  Vascular: Dorsalis Pedis and Posterior Tibial pedal pulses 2/4, Capillary Fill Time 3 seconds, (+) pedal hair growth bilateral, no edema bilateral lower extremities, Temperature gradient within normal limits.  Neurology: Gross sensation intact via light touch bilateral.  Musculoskeletal: Mild tenderness with palpation right>left bunion deformity, no limitation or crepitus with range of motion, deformity reducible, tracking not trackbound, there is no 1st ray hypermobility noted bilateral. Midtarsal, Subtalar joint, and ankle joint range of motion is within normal limits. On weightbearing exam, there is decreased 1st MTPJ rom Right>Left with functional limitus noted,  there is medial arch collapse Right> Left on weightbearing, rearfoot slight varus/valgus, forefoot slight abduction with HAV deformity supported on ground with no second toe crossover deformity noted.   Gait: Non-Antalgic gait with increased medial arch collapse and pronatory influence noted on Right> Left foot with medial 1st MTPJ roll-off at toe-off, heel off within normal limits.   Xrays  Right Foot    Impression: Intermetatarsal angle above normal limits consistent with bunion.  No other acute deformities.      Assessment and Plan: Problem List Items Addressed This Visit    None    Visit Diagnoses    Bunion    -  Primary   Foot pain, bilateral           -Complete examination performed -Xrays reviewed -Discussed treatement options; discussed HAV deformity;conservative and  Surgical management; risks, benefits, alternatives discussed. All patient's questions answered. -Rx bunion shield -Recommend continue with good supportive shoes   -Patient to return to office in 1 month for surgery consult for right or sooner if condition worsens.  Asencion Islamitorya Liona Wengert, DPM

## 2018-05-28 ENCOUNTER — Ambulatory Visit: Payer: Medicaid Other | Admitting: Sports Medicine

## 2018-05-28 ENCOUNTER — Encounter: Payer: Self-pay | Admitting: Sports Medicine

## 2018-05-28 DIAGNOSIS — M216X1 Other acquired deformities of right foot: Secondary | ICD-10-CM

## 2018-05-28 DIAGNOSIS — M21619 Bunion of unspecified foot: Secondary | ICD-10-CM

## 2018-05-28 DIAGNOSIS — M79671 Pain in right foot: Secondary | ICD-10-CM

## 2018-05-28 NOTE — Patient Instructions (Signed)
Pre-Operative Instructions  Congratulations, you have decided to take an important step towards improving your quality of life.  You can be assured that the doctors and staff at [facility] will be with you every step of the way.  Here are some important things you should know:  1. Plan to be at the surgery center/hospital at least 1 (one) hour prior to your scheduled time, unless otherwise directed by the surgical center/hospital staff.  You must have a responsible adult accompany you, remain during the surgery and drive you home.  Make sure you have directions to the surgical center/hospital to ensure you arrive on time. 2. If you are having surgery at [facility] or [facility] hospitals, you will need a copy of your medical history and physical form from your family physician within one month prior to the date of surgery. We will give you a form for your primary physician to complete.  3. We make every effort to accommodate the date you request for surgery.  However, there are times where surgery dates or times have to be moved.  We will contact you as soon as possible if a change in schedule is required.   4. No aspirin/ibuprofen for one week before surgery.  If you are on aspirin, any non-steroidal anti-inflammatory medications (Mobic, Aleve, Ibuprofen) should not be taken seven (7) days prior to your surgery.  You make take Tylenol for pain prior to surgery.  5. Medications - If you are taking daily heart and blood pressure medications, seizure, reflux, allergy, asthma, anxiety, pain or diabetes medications, make sure you notify the surgery center/hospital before the day of surgery so they can tell you which medications you should take or avoid the day of surgery. 6. No food or drink after midnight the night before surgery unless directed otherwise by surgical center/hospital staff. 7. No alcoholic beverages 24-hours prior to surgery.  No smoking 24-hours prior or 24-hours after  surgery. 8. Wear loose pants or shorts. They should be loose enough to fit over bandages, boots, and casts. 9. Don't wear slip-on shoes. Sneakers are preferred. 10. Bring your boot with you to the surgery center/hospital.  Also bring crutches or a walker if your physician has prescribed it for you.  If you do not have this equipment, it will be provided for you after surgery. 11. If you have not been contacted by the surgery center/hospital by the day before your surgery, call to confirm the date and time of your surgery. 12. Leave-time from work may vary depending on the type of surgery you have.  Appropriate arrangements should be made prior to surgery with your employer. 13. Prescriptions will be provided immediately following surgery by your doctor.  Fill these as soon as possible after surgery and take the medication as directed. Pain medications will not be refilled on weekends and must be approved by the doctor. 14. Remove nail polish on the operative foot and avoid getting pedicures prior to surgery. 15. Wash the night before surgery.  The night before surgery wash the foot and leg well with water and the antibacterial soap provided. Be sure to pay special attention to beneath the toenails and in between the toes.  Wash for at least three (3) minutes. Rinse thoroughly with water and dry well with a towel.  Perform this wash unless told not to do so by your physician.  Enclosed: 1 Ice pack (please put in freezer the night before surgery)   1 Hibiclens skin cleaner     Pre-op instructions  If you have any questions regarding the instructions, please do not hesitate to call our office.  [place]: [address], [place], [place] [address] -- [phone]  [place]: [address] [place], [place] [address] -- [phone]  [place]: [address]  [place], [place] [address] -- [phone]  [place]: [address], [address], [place], [place] [address] -- [phone]  Website:  [email or web address] 

## 2018-05-28 NOTE — Progress Notes (Signed)
Subjective: Susan Austin is a 32 y.o. female patient who presents to office for follow up evaluation of Right> Left bunion pain. Reports that she has been using bunion shield but the pain is still achy over the area. Denies any changes since last visit with medical history. Patient is here to further discuss surgery and has her significant other with her. Patient denies any other pedal complaints.    Patient Active Problem List   Diagnosis Date Noted  . Long term current use of anticoagulant 07/03/2010  . PE 09/26/2009    Current Outpatient Medications on File Prior to Visit  Medication Sig Dispense Refill  . phentermine 30 MG capsule Take by mouth.     No current facility-administered medications on file prior to visit.     No Known Allergies  Objective:  General: Alert and oriented x3 in no acute distress; No acute changes since last visit   Dermatology: No open lesions bilateral lower extremities, no webspace macerations, no ecchymosis bilateral, all nails x 10 are well manicured.  Vascular: Dorsalis Pedis and Posterior Tibial pedal pulses 2/4, Capillary Fill Time 3 seconds, (+) pedal hair growth bilateral, no edema bilateral lower extremities, Temperature gradient within normal limits.  Neurology: Gross sensation intact via light touch bilateral.  Musculoskeletal: Mild tenderness with palpation right>left bunion deformity, no limitation or crepitus with range of motion, deformity reducible, tracking not trackbound, there is no 1st ray hypermobility noted bilateral. Midtarsal, Subtalar joint, and ankle joint range of motion is within normal limits. 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       Assessment and  Plan: Problem List Items Addressed This Visit    None    Visit Diagnoses    Bunion    -  Primary   Right foot pain           -Complete examination performed -Xrays reviewed from last visit  -Re-Discussed treatement options; discussed HAV deformity;conservative and  Surgical management; risks, benefits, alternatives discussed. All patient's questions answered. -Patient opt for surgical management. Consent obtained for bunionectomy ([facility] Austin) right foot with internal fixation. Pre and Post op course explained. Risks, benefits, alternatives explained. No guarantees given or implied. Surgical booking slip submitted and provided patient with Surgical packet and info for [facility] -To dispensed CAM Walker and crutches at [facility], NWB x 4 weeks -Patient to F/u after surgery or sooner if problems arise.  Asencion Islamitorya Artesia Berkey, DPM

## 2018-06-17 ENCOUNTER — Telehealth: Payer: Self-pay | Admitting: Sports Medicine

## 2018-06-17 ENCOUNTER — Telehealth: Payer: Self-pay | Admitting: *Deleted

## 2018-06-17 ENCOUNTER — Other Ambulatory Visit: Payer: Self-pay | Admitting: Sports Medicine

## 2018-06-17 DIAGNOSIS — G8918 Other acute postprocedural pain: Secondary | ICD-10-CM

## 2018-06-17 DIAGNOSIS — Z9889 Other specified postprocedural states: Secondary | ICD-10-CM

## 2018-06-17 DIAGNOSIS — K5903 Drug induced constipation: Secondary | ICD-10-CM

## 2018-06-17 DIAGNOSIS — R112 Nausea with vomiting, unspecified: Secondary | ICD-10-CM

## 2018-06-17 NOTE — Telephone Encounter (Signed)
Pt states she had a death in the family over the weekend and would like to know if she could go either by flight or car.

## 2018-06-17 NOTE — Telephone Encounter (Signed)
I informed pt and she states she would like to attend the funeral, and I transferred pt to the surgery coordinator.

## 2018-06-17 NOTE — Progress Notes (Signed)
Entered in error -Dr. Trinitie Mcgirr

## 2018-06-17 NOTE — Telephone Encounter (Signed)
Dr. Marylene Land was contacted by [facility] - Surgery coordinator and informed of pt's current personal problem. Dr. Marylene Land states pt is to be non-weight bearing after surgery and the travel either way would increase her chances of a DVT, recommended that if pt wanted to travel to the funeral, she should postpone the surgery.

## 2018-06-17 NOTE — Telephone Encounter (Signed)
"  My mother passed away.  I have to fly to [place].  I'm scheduled to have surgery today.  Will it be okay for me to fly?"  Dr. Marylene Land said she recommends you  reschedule your surgery because you risk the possibility of developing a blood clot, increased swelling, and pain.  "Can I reschedule it to Monday?"  Are you referring to Monday of next week?  "Yes, can we do it next week."  I'll get it rescheduled.  Sorry for the loss of your mom.

## 2018-06-17 NOTE — Telephone Encounter (Signed)
Pt has a couple a questions about her surgery she is getting today at 11:15pm. Please call patient

## 2018-06-18 NOTE — Telephone Encounter (Signed)
Postop appointments have been rescheduled. °

## 2018-06-25 ENCOUNTER — Other Ambulatory Visit: Payer: Medicaid Other

## 2018-07-02 ENCOUNTER — Encounter: Payer: Medicaid Other | Admitting: *Deleted

## 2018-07-02 NOTE — Progress Notes (Signed)
This encounter was created in error - please disregard.

## 2018-07-09 ENCOUNTER — Encounter: Payer: Medicaid Other | Admitting: Podiatry

## 2018-07-09 NOTE — Progress Notes (Signed)
This encounter was created in error - please disregard.

## 2019-03-11 ENCOUNTER — Telehealth: Payer: Self-pay | Admitting: *Deleted

## 2019-03-11 NOTE — Telephone Encounter (Signed)
"  I want to see about getting my surgery rescheduled.  If you can, give me a call."

## 2019-03-19 NOTE — Telephone Encounter (Signed)
I'm returning your call.  "I was wondering if I could set up my surgery with Dr. Cannon Kettle."  Yes, I can schedule it for you.  Dr. Cannon Kettle does surgeries on Mondays.  Do you have a date that you like?  "Actually, can I call you back?  [place]'m [place] of [place] right now."  Sure you can call me back.  Keep in mind that you will need to schedule an appointment to see Dr. Cannon Kettle for a consultation.  You last signed your consent form in December of last year.  "So in other words, I'm going to have to see her all over again?"  Yes, you will need to see her before having the surgery.  "Okay, thank you."

## 2019-06-03 ENCOUNTER — Ambulatory Visit: Payer: Medicaid Other | Admitting: Obstetrics

## 2019-06-28 ENCOUNTER — Emergency Department (HOSPITAL_BASED_OUTPATIENT_CLINIC_OR_DEPARTMENT_OTHER): Payer: Medicaid Other

## 2019-06-28 ENCOUNTER — Encounter (HOSPITAL_BASED_OUTPATIENT_CLINIC_OR_DEPARTMENT_OTHER): Payer: Self-pay | Admitting: Emergency Medicine

## 2019-06-28 ENCOUNTER — Emergency Department (HOSPITAL_BASED_OUTPATIENT_CLINIC_OR_DEPARTMENT_OTHER)
Admission: EM | Admit: 2019-06-28 | Discharge: 2019-06-28 | Disposition: A | Discharge status: Home / Self Care | Payer: Medicaid Other | Attending: Emergency Medicine | Admitting: Emergency Medicine

## 2019-06-28 ENCOUNTER — Other Ambulatory Visit: Payer: Self-pay

## 2019-06-28 DIAGNOSIS — R079 Chest pain, unspecified: Secondary | ICD-10-CM | POA: Diagnosis not present

## 2019-06-28 DIAGNOSIS — K5900 Constipation, unspecified: Secondary | ICD-10-CM | POA: Insufficient documentation

## 2019-06-28 DIAGNOSIS — G8918 Other acute postprocedural pain: Secondary | ICD-10-CM | POA: Diagnosis not present

## 2019-06-28 DIAGNOSIS — R0602 Shortness of breath: Secondary | ICD-10-CM | POA: Diagnosis present

## 2019-06-28 DIAGNOSIS — R110 Nausea: Secondary | ICD-10-CM

## 2019-06-28 DIAGNOSIS — Z79899 Other long term (current) drug therapy: Secondary | ICD-10-CM | POA: Insufficient documentation

## 2019-06-28 DIAGNOSIS — Z20822 Contact with and (suspected) exposure to covid-19: Secondary | ICD-10-CM | POA: Insufficient documentation

## 2019-06-28 LAB — CBC WITH DIFFERENTIAL/PLATELET
Abs Immature Granulocytes: 0.03 10*3/uL (ref 0.00–0.07)
Basophils Absolute: 0 10*3/uL (ref 0.0–0.1)
Basophils Relative: 0 %
Eosinophils Absolute: 0.1 10*3/uL (ref 0.0–0.5)
Eosinophils Relative: 2 %
HCT: 23.5 % — ABNORMAL LOW (ref 36.0–46.0)
Hemoglobin: 7.7 g/dL — ABNORMAL LOW (ref 12.0–15.0)
Immature Granulocytes: 0 %
Lymphocytes Relative: 27 %
Lymphs Abs: 1.9 10*3/uL (ref 0.7–4.0)
MCH: 29.4 pg (ref 26.0–34.0)
MCHC: 32.8 g/dL (ref 30.0–36.0)
MCV: 89.7 fL (ref 80.0–100.0)
Monocytes Absolute: 0.5 10*3/uL (ref 0.1–1.0)
Monocytes Relative: 8 %
Neutro Abs: 4.4 10*3/uL (ref 1.7–7.7)
Neutrophils Relative %: 63 %
Platelets: 362 10*3/uL (ref 150–400)
RBC: 2.62 MIL/uL — ABNORMAL LOW (ref 3.87–5.11)
RDW: 12.9 % (ref 11.5–15.5)
WBC: 7.1 10*3/uL (ref 4.0–10.5)
nRBC: 0.4 % — ABNORMAL HIGH (ref 0.0–0.2)

## 2019-06-28 LAB — COMPREHENSIVE METABOLIC PANEL
ALT: 54 U/L — ABNORMAL HIGH (ref 0–44)
AST: 54 U/L — ABNORMAL HIGH (ref 15–41)
Albumin: 2.8 g/dL — ABNORMAL LOW (ref 3.5–5.0)
Alkaline Phosphatase: 36 U/L — ABNORMAL LOW (ref 38–126)
Anion gap: 9 (ref 5–15)
BUN: 6 mg/dL (ref 6–20)
CO2: 26 mmol/L (ref 22–32)
Calcium: 8.6 mg/dL — ABNORMAL LOW (ref 8.9–10.3)
Chloride: 101 mmol/L (ref 98–111)
Creatinine, Ser: 0.65 mg/dL (ref 0.44–1.00)
GFR calc Af Amer: >60 mL/min (ref >60)
GFR calc non Af Amer: >60 mL/min (ref >60)
Glucose, Bld: 98 mg/dL (ref 70–99)
Potassium: 3.8 mmol/L (ref 3.5–5.1)
Sodium: 136 mmol/L (ref 135–145)
Total Bilirubin: 0.9 mg/dL (ref 0.3–1.2)
Total Protein: 6.2 g/dL — ABNORMAL LOW (ref 6.5–8.1)

## 2019-06-28 LAB — SARS CORONAVIRUS 2 AG (30 MIN TAT): SARS Coronavirus 2 Ag: NEGATIVE

## 2019-06-28 LAB — PREGNANCY, URINE: Preg Test, Ur: NEGATIVE

## 2019-06-28 MED ORDER — ACETAMINOPHEN 500 MG PO TABS
1000.0000 mg | ORAL_TABLET | Freq: Once | ORAL | Status: AC
  Administered 2019-06-28: 1000 mg via ORAL
  Filled 2019-06-28: qty 2

## 2019-06-28 MED ORDER — ONDANSETRON HCL 4 MG/2ML IJ SOLN
4.0000 mg | Freq: Once | INTRAMUSCULAR | Status: AC
  Administered 2019-06-28: 4 mg via INTRAVENOUS
  Filled 2019-06-28: qty 2

## 2019-06-28 MED ORDER — POLYETHYLENE GLYCOL 3350 17 GM/SCOOP PO POWD: ORAL | 0 refills | Status: DC

## 2019-06-28 MED ORDER — IOHEXOL 350 MG/ML SOLN
100.0000 mL | Freq: Once | INTRAVENOUS | Status: AC | PRN
  Administered 2019-06-28: 85 mL via INTRAVENOUS

## 2019-06-28 MED ORDER — MORPHINE SULFATE (PF) 4 MG/ML IV SOLN
4.0000 mg | Freq: Once | INTRAVENOUS | Status: AC
  Administered 2019-06-28: 4 mg via INTRAVENOUS
  Filled 2019-06-28: qty 1

## 2019-06-28 MED ORDER — SODIUM CHLORIDE 0.9 % IV BOLUS
1000.0000 mL | Freq: Once | INTRAVENOUS | Status: AC
  Administered 2019-06-28: 1000 mL via INTRAVENOUS

## 2019-06-28 MED ORDER — DOCUSATE SODIUM 100 MG PO CAPS: 100.0000 mg | ORAL_CAPSULE | Freq: Two times a day (BID) | ORAL | 0 refills | Status: DC

## 2019-06-28 MED ORDER — FAMOTIDINE 20 MG PO TABS: 20.0000 mg | ORAL_TABLET | Freq: Two times a day (BID) | ORAL | 0 refills | Status: DC

## 2019-06-28 NOTE — ED Notes (Signed)
Provider at the bedside.  

## 2019-06-28 NOTE — ED Notes (Signed)
Ambulated around room.  Heart rate noted to jump between 125-135 with RR 40 and sats in mid to low 90's.  Reports feeling SOB with exertion.

## 2019-06-28 NOTE — ED Provider Notes (Signed)
[facility] EMERGENCY DEPARTMENT Provider Note   CSN: [id number] Arrival date & time: 06/28/19  1458     History Chief Complaint  Patient presents with   Post-op Problem    Susan Austin is a 34 y.o. female presenting for evaluation of chest pain and shortness of breath.   Patient states she developed chest pain and shortness of breath last night.  Pain is in the middle of her chest, described as a burning.  Not worse with inspiration.  Shortness of breath is worse with exertion.  Patient states she has a history of PE in 2012, states this feels similar.  Patient had liposuction surgery 5 days ago in [place], flew from [place] to [place] 2 days ago.  Additionally, patient reports chills, nausea/vomiting (2 episodes yesterday) and constipation since before the surgery.  She has been taking Percocet which makes her nauseous, has not been taking anything else.  She has an antiemetic at home, but does not know how to take it and thus has not been using it.  She is not on any stool softeners or MiraLAX.  She reports mild abdominal pain, but states that this has been constant since surgery, no increase recently.  She denies urinary symptoms.  She states all of her family members have blood clots and are on thinners, unknown if they are provoked or due to a rotatory blood disease.  Patient states her last PE was contributed to being on OCPs, she is no longer on OCPs.   HPI     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    Pulmonary emboli [facility])     Patient Active Problem List   Diagnosis Date Noted   Long term current use of anticoagulant 07/03/2010   PE 09/26/2009    Past Surgical History:  Procedure Laterality Date   CESAREAN SECTION       OB History    Gravida  5   Para  2   Term  2   Preterm      AB  2   Living  2     SAB  2   TAB      Ectopic      Multiple      Live Births               Family History  Problem Relation Age of Onset   Alcohol abuse Neg Hx    Arthritis Neg Hx    Asthma Neg Hx    Birth defects Neg Hx    Cancer Neg Hx    COPD Neg Hx    Depression Neg Hx    Diabetes Neg Hx    Drug abuse Neg Hx    Early death Neg Hx    Hearing loss Neg Hx    Heart disease Neg Hx    Hyperlipidemia Neg Hx    Hypertension Neg Hx    Kidney disease Neg Hx    Learning disabilities Neg Hx    Mental illness Neg Hx    Mental retardation Neg Hx    Miscarriages / Stillbirths Neg Hx    Stroke Neg Hx    Vision loss Neg Hx     Social History   Tobacco Use   Smoking status: Never Smoker   Smokeless tobacco: Never Used  Substance Use Topics   Alcohol use: No   Drug use: No    Home Medications Prior to Admission medications  Medication Sig Start Date End Date Taking? Authorizing Provider  docusate sodium (COLACE) 100 MG capsule Take 1 capsule (100 mg total) by mouth every 12 (twelve) hours. 06/28/19   Breshay Ilg, PA-C  famotidine (PEPCID) 20 MG tablet Take 1 tablet (20 mg total) by mouth 2 (two) times daily. 06/28/19   Mersadie Kavanaugh, PA-C  phentermine 30 MG capsule Take by mouth.    [provider]  polyethylene glycol powder (MIRALAX) 17 GM/SCOOP powder Use 1-2 scoops of powder in 8 ounces of liquid up to 3 times a day until having regular bowel movements. 06/28/19   Shanie Mauzy, PA-C    Allergies    Patient has no known allergies.  Review of Systems   Review of Systems  Respiratory: Positive for shortness of breath.   Cardiovascular: Positive for chest pain.  Gastrointestinal: Positive for constipation, nausea and vomiting.  All other systems reviewed and are negative.   Physical Exam Updated Vital Signs BP 123/83    Pulse 97    Temp 98.7 F (37.1 C) (Oral)    Resp 16    Ht 5\' 3"  (1.6 m)    Wt 86.2 kg    LMP 06/21/2019    SpO2 100%    BMI 33.66 kg/m   Physical Exam Vitals and nursing note reviewed.   Constitutional:      General: She is not in acute distress.    Appearance: She is well-developed.     Comments: Appears dehydrated, but otherwise nontoxic in appearance  HENT:     Head: Normocephalic and atraumatic.  Eyes:     Extraocular Movements: Extraocular movements intact.     Conjunctiva/sclera: Conjunctivae normal.     Pupils: Pupils are equal, round, and reactive to light.  Cardiovascular:     Rate and Rhythm: Regular rhythm. Tachycardia present.     Pulses: Normal pulses.     Comments: Tachycardic between 110- 120 Pulmonary:     Effort: Pulmonary effort is normal. No respiratory distress.     Breath sounds: Normal breath sounds. No wheezing.     Comments: Speaking in full sentences.  Clear lung sounds in all fields. Abdominal:     General: There is no distension.     Palpations: Abdomen is soft. There is no mass.     Tenderness: There is no abdominal tenderness. There is no guarding or rebound.  Musculoskeletal:        General: Normal range of motion.     Cervical back: Normal range of motion and neck supple.     Right lower leg: No edema.     Left lower leg: No edema.     Comments: No leg pain or swelling  Skin:    General: Skin is warm and dry.     Capillary Refill: Capillary refill takes less than 2 seconds.  Neurological:     Mental Status: She is alert and oriented to person, place, and time.     ED Results / Procedures / Treatments   Labs (all labs ordered are listed, but only abnormal results are displayed) Labs Reviewed  CBC WITH DIFFERENTIAL/PLATELET - Abnormal; Notable for the following components:      Result Value   RBC 2.62 (*)    Hemoglobin 7.7 (*)    HCT 23.5 (*)    nRBC 0.4 (*)    All other components within normal limits  COMPREHENSIVE METABOLIC PANEL - Abnormal; Notable for the following components:   Calcium 8.6 (*)    Total  Protein 6.2 (*)    Albumin 2.8 (*)    AST 54 (*)    ALT 54 (*)    Alkaline Phosphatase 36 (*)    All other  components within normal limits  SARS CORONAVIRUS 2 AG (30 MIN TAT)  PREGNANCY, URINE    EKG EKG Interpretation  Date/Time:  Saturday June 28 2019 15:34:27 EST Ventricular Rate:  99 PR Interval:    QRS Duration: 90 QT Interval:  355 QTC Calculation: 456 R Axis:   55 Text Interpretation: Sinus rhythm Baseline wander in lead(s) V6 No significant change since 12/18 Confirmed by Aletta Edouard [phone]) on 06/28/2019 3:40:15 PM   Radiology CT Angio Chest PE W and/or Wo Contrast  Result Date: 06/28/2019 CLINICAL DATA:  Shortness of breath. EXAM: CT ANGIOGRAPHY CHEST WITH CONTRAST TECHNIQUE: Multidetector CT imaging of the chest was performed using the standard protocol during bolus administration of intravenous contrast. Multiplanar CT image reconstructions and MIPs were obtained to evaluate the vascular anatomy. CONTRAST:  74mL OMNIPAQUE IOHEXOL 350 MG/ML SOLN COMPARISON:  May 26, 2017 FINDINGS: Cardiovascular: Satisfactory opacification of the pulmonary arteries to the segmental level. No evidence of pulmonary embolism. Normal heart size. No pericardial effusion. Mediastinum/Nodes: No enlarged mediastinal, hilar, or axillary lymph nodes. Thyroid gland, trachea, and esophagus demonstrate no significant findings. Small hiatal hernia. Lungs/Pleura: Lungs are clear. No pleural effusion or pneumothorax. Upper Abdomen: No acute abnormality. Musculoskeletal: Diffuse chest wall subcutaneous stranding with multiple areas of subcutaneous emphysema, worse in the left upper posterior chest wall. No acute or significant osseous findings. Review of the MIP images confirms the above findings. IMPRESSION: 1. No evidence of pulmonary embolus. 2. Diffuse chest wall subcutaneous stranding with multiple areas of subcutaneous emphysema, worse in the left upper posterior chest wall, presumably postprocedural. Please correlate clinically for signs of cellulitis. Electronically Signed   By: Fidela Salisbury M.D.    On: 06/28/2019 18:35    Procedures Procedures (including critical care time)  Medications Ordered in ED Medications  acetaminophen (TYLENOL) tablet 1,000 mg (1,000 mg Oral Given 06/28/19 1716)  sodium chloride 0.9 % bolus 1,000 mL (0 mLs Intravenous Stopped 06/28/19 1843)  ondansetron (ZOFRAN) injection 4 mg (4 mg Intravenous Given 06/28/19 1709)  morphine 4 MG/ML injection 4 mg (4 mg Intravenous Given 06/28/19 1710)  iohexol (OMNIPAQUE) 350 MG/ML injection 100 mL (85 mLs Intravenous Contrast Given 06/28/19 1809)    ED Course  I have reviewed the triage vital signs and the nursing notes.  Pertinent labs & imaging results that were available during my care of the patient were reviewed by me and considered in my medical decision making (see chart for details).  Clinical Course as of Jun 27 2037  Sat Jun 27, 5566  [id number] 34 year old female who just had a liposuction under the breasts and back while in [place] here now with shortness of breath and worsening pain.  History of PE.  Pulse ox 100%.  Her chest CT did not show any evidence of PE but does have some subcu air likely just from the surgery.  Very anemic with a hemoglobin of 7.7.  She attributes it to blood loss from the surgery.  She was offered admission which she declines.  She understands she needs to follow-up and get some blood drawn to make sure her hemoglobin is not falling worse and to return if any worsening symptoms.   [MB]    Clinical Course User Index [MB] Hayden Rasmussen, MD   MDM Rules/Calculators/A&P  Patient presenting for evaluation of chest pain and shortness of breath after surgery 5 days ago.  Physical exam shows patient appears mildly dehydrated, but otherwise nontoxic.  Pulmonary exam is reassuring.  She is tachycardic however, as such consider PE.  Patient with a history of the same.  Also consider dehydration, postop infection, atelectasis.  While patient is having abdominal pain, this is been  constant since surgery.  No increase.  Abdominal exam without significant tenderness or signs of peritonitis.  Low suspicion for infection at the surgical site.  Patient's constipation is likely due to pain medication, doubt bowel obstruction.  Labs show anemia at 7.7, which is likely due to surgery, no recent to compare.  When asked, patient states she is normally around 13.  Otherwise labs are reassuring.  No leukocytosis.  Electrolytes stable.  CTA negative for PE or postop pna.  Heart rate has improved with fluid bolus.  On reassessment, patient reports she is feeling better, but not completely back to baseline.  Blood pressure did not change with orthostatics, however she did become tachycardic, including with walking.  She was not significantly weak, dizzy, lightheaded, or unsteady.  Discussed with patient tachycardia could be due to dehydration, pain, or anemia.  Discussed option of admission for hemoglobin trending and possible transfusion versus symptomatic treatment at home and close follow-up with PCP for hemoglobin recheck.  Patient elects to go home.  I discussed importance of strict symptom monitoring and prompt return to the ER if anything worsens.  At this time, patient appears safe for discharge.  Patient states she understands and agrees to plan.  Final Clinical Impression(s) / ED Diagnoses Final diagnoses:  Constipation, unspecified constipation type  Nausea  Post-operative pain    Rx / DC Orders ED Discharge Orders         Ordered    famotidine (PEPCID) 20 MG tablet  2 times daily     06/28/19 1930    docusate sodium (COLACE) 100 MG capsule  Every 12 hours     06/28/19 1930    polyethylene glycol powder (MIRALAX) 17 GM/SCOOP powder     06/28/19 1930           Yocelin Vanlue, PA-C 06/28/19 2039    Terrilee Files, MD 06/29/19 1205

## 2019-06-28 NOTE — ED Notes (Signed)
Pt on cardiac monitor and auto VS q30min 

## 2019-06-28 NOTE — Discharge Instructions (Addendum)
Use Tylenol as needed for mild to moderate pain.  Use Percocet as needed for severe breakthrough pain. Use Zofran every 4-6 hours as needed for nausea.  Put it under your tongue, and it will dissolve. Take Pepcid daily to decrease stomach/chest burning Take Colace twice a day to make your stool softer. Use 1-2 scoops of MiraLAX in 8 ounces of liquid up to 3 times a day until you are having regular bowel movements. Make sure you are staying well-hydrated water.  Your urine should be clear to pale yellow. Follow-up with your primary care doctor for recheck of your hemoglobin. Return to the emergency room if any new, worsening, or concerning symptoms.

## 2019-06-28 NOTE — ED Triage Notes (Signed)
Pt has surgery ( liposuction) on Monday - she states that she has had continued pain since then. Last night she reports that she started to have chest pain and SOB  - she has a hx of Blood clots in her legs and chest

## 2019-06-29 ENCOUNTER — Emergency Department (HOSPITAL_BASED_OUTPATIENT_CLINIC_OR_DEPARTMENT_OTHER)
Admission: EM | Admit: 2019-06-29 | Discharge: 2019-06-29 | Disposition: A | Discharge status: Home / Self Care | Payer: Medicaid Other | Attending: Emergency Medicine | Admitting: Emergency Medicine

## 2019-06-29 ENCOUNTER — Other Ambulatory Visit: Payer: Self-pay

## 2019-06-29 ENCOUNTER — Encounter (HOSPITAL_BASED_OUTPATIENT_CLINIC_OR_DEPARTMENT_OTHER): Payer: Self-pay | Admitting: Emergency Medicine

## 2019-06-29 DIAGNOSIS — M79601 Pain in right arm: Secondary | ICD-10-CM | POA: Insufficient documentation

## 2019-06-29 DIAGNOSIS — K5900 Constipation, unspecified: Secondary | ICD-10-CM | POA: Insufficient documentation

## 2019-06-29 DIAGNOSIS — Z5321 Procedure and treatment not carried out due to patient leaving prior to being seen by health care provider: Secondary | ICD-10-CM | POA: Diagnosis not present

## 2019-06-29 DIAGNOSIS — M79604 Pain in right leg: Secondary | ICD-10-CM | POA: Insufficient documentation

## 2019-06-29 NOTE — ED Triage Notes (Signed)
Liposuction on Monday. States she has pain to her legs and arms and is constipated. States nothing has changed so she came back.

## 2019-06-29 NOTE — ED Notes (Signed)
Instructed on no visitors after being seen by triage. States," What is your problem " Visitor left waiting room

## 2019-06-29 NOTE — ED Notes (Signed)
Pt became very rude and argumentative when told there was no room available and that she would be returned to the waiting room.

## 2019-06-30 ENCOUNTER — Emergency Department (HOSPITAL_BASED_OUTPATIENT_CLINIC_OR_DEPARTMENT_OTHER)
Admission: EM | Admit: 2019-06-30 | Discharge: 2019-06-30 | Disposition: A | Discharge status: Home / Self Care | Payer: Medicaid Other | Attending: Emergency Medicine | Admitting: Emergency Medicine

## 2019-06-30 ENCOUNTER — Other Ambulatory Visit: Payer: Self-pay

## 2019-06-30 ENCOUNTER — Encounter (HOSPITAL_BASED_OUTPATIENT_CLINIC_OR_DEPARTMENT_OTHER): Payer: Self-pay | Admitting: *Deleted

## 2019-06-30 DIAGNOSIS — G8918 Other acute postprocedural pain: Secondary | ICD-10-CM

## 2019-06-30 DIAGNOSIS — Z86718 Personal history of other venous thrombosis and embolism: Secondary | ICD-10-CM | POA: Insufficient documentation

## 2019-06-30 DIAGNOSIS — Z7901 Long term (current) use of anticoagulants: Secondary | ICD-10-CM | POA: Insufficient documentation

## 2019-06-30 DIAGNOSIS — Z86711 Personal history of pulmonary embolism: Secondary | ICD-10-CM | POA: Insufficient documentation

## 2019-06-30 DIAGNOSIS — D649 Anemia, unspecified: Secondary | ICD-10-CM | POA: Insufficient documentation

## 2019-06-30 DIAGNOSIS — R5383 Other fatigue: Secondary | ICD-10-CM | POA: Diagnosis present

## 2019-06-30 LAB — CBC WITH DIFFERENTIAL/PLATELET
Abs Immature Granulocytes: 0.07 10*3/uL (ref 0.00–0.07)
Basophils Absolute: 0 10*3/uL (ref 0.0–0.1)
Basophils Relative: 0 %
Eosinophils Absolute: 0.2 10*3/uL (ref 0.0–0.5)
Eosinophils Relative: 2 %
HCT: 25.7 % — ABNORMAL LOW (ref 36.0–46.0)
Hemoglobin: 8.1 g/dL — ABNORMAL LOW (ref 12.0–15.0)
Immature Granulocytes: 1 %
Lymphocytes Relative: 24 %
Lymphs Abs: 2.3 10*3/uL (ref 0.7–4.0)
MCH: 28.8 pg (ref 26.0–34.0)
MCHC: 31.5 g/dL (ref 30.0–36.0)
MCV: 91.5 fL (ref 80.0–100.0)
Monocytes Absolute: 0.7 10*3/uL (ref 0.1–1.0)
Monocytes Relative: 8 %
Neutro Abs: 6.2 10*3/uL (ref 1.7–7.7)
Neutrophils Relative %: 65 %
Platelets: 430 10*3/uL — ABNORMAL HIGH (ref 150–400)
RBC: 2.81 MIL/uL — ABNORMAL LOW (ref 3.87–5.11)
RDW: 13.5 % (ref 11.5–15.5)
WBC: 9.6 10*3/uL (ref 4.0–10.5)
nRBC: 0.4 % — ABNORMAL HIGH (ref 0.0–0.2)

## 2019-06-30 LAB — BASIC METABOLIC PANEL
Anion gap: 10 (ref 5–15)
BUN: 6 mg/dL (ref 6–20)
CO2: 24 mmol/L (ref 22–32)
Calcium: 8.4 mg/dL — ABNORMAL LOW (ref 8.9–10.3)
Chloride: 102 mmol/L (ref 98–111)
Creatinine, Ser: 0.71 mg/dL (ref 0.44–1.00)
GFR calc Af Amer: >60 mL/min (ref >60)
GFR calc non Af Amer: >60 mL/min (ref >60)
Glucose, Bld: 101 mg/dL — ABNORMAL HIGH (ref 70–99)
Potassium: 3.4 mmol/L — ABNORMAL LOW (ref 3.5–5.1)
Sodium: 136 mmol/L (ref 135–145)

## 2019-06-30 MED ORDER — FENTANYL CITRATE (PF) 100 MCG/2ML IJ SOLN
50.0000 ug | Freq: Once | INTRAMUSCULAR | Status: AC
  Administered 2019-06-30: 50 ug via INTRAVENOUS
  Filled 2019-06-30: qty 2

## 2019-06-30 MED ORDER — KETOROLAC TROMETHAMINE 30 MG/ML IJ SOLN
30.0000 mg | Freq: Once | INTRAMUSCULAR | Status: AC
  Administered 2019-06-30: 30 mg via INTRAVENOUS
  Filled 2019-06-30: qty 1

## 2019-06-30 MED ORDER — ONDANSETRON HCL 4 MG/2ML IJ SOLN
4.0000 mg | Freq: Once | INTRAMUSCULAR | Status: AC
  Administered 2019-06-30: 4 mg via INTRAVENOUS
  Filled 2019-06-30: qty 2

## 2019-06-30 MED ORDER — FERROUS SULFATE 325 (65 FE) MG PO TABS: 325.0000 mg | ORAL_TABLET | Freq: Every day | ORAL | 0 refills | Status: DC

## 2019-06-30 MED ORDER — SODIUM CHLORIDE 0.9 % IV BOLUS
1000.0000 mL | Freq: Once | INTRAVENOUS | Status: AC
  Administered 2019-06-30: 1000 mL via INTRAVENOUS

## 2019-06-30 MED ORDER — NAPROXEN 500 MG PO TABS: 500.0000 mg | ORAL_TABLET | Freq: Two times a day (BID) | ORAL | 0 refills | Status: DC

## 2019-06-30 NOTE — ED Notes (Signed)
Pt was given blankets

## 2019-06-30 NOTE — ED Triage Notes (Addendum)
Pt states that she had liposuction surgery under her breast area on Monday in [place]. States she was seen on Saturday for chest pain and feeling light headed. States during that visit she had a low hgb, but did not want to be admitted. Pt returned yesterday for symptoms of fatigue, but decided to leave without being seen. Pt returns this morning with feelings of fatigue. C/o general pain. States pain is constant. Denies sob. resp even and unlabored. Sinus on monitor.

## 2019-06-30 NOTE — ED Notes (Signed)
Pt called for a ride home. States she feels better. Instructed pt to call out when her ride arrives and we will assist her with getting dressed and to the car.

## 2019-06-30 NOTE — Discharge Instructions (Addendum)
You were seen today for postoperative pain and fatigue.  Your labs are stable.  Your hemoglobin is 8.1.  You are anemic but there is no indication for transfusion.  You need to start supplemental iron.  This can cause worsening constipation.  Make sure to continue your bowel regimen.  You should start naproxen twice daily for antiinflammation given your ongoing discomfort.  Make sure that you are staying hydrated.

## 2019-06-30 NOTE — ED Notes (Signed)
MD with pt  

## 2019-06-30 NOTE — ED Provider Notes (Signed)
[name] EMERGENCY DEPARTMENT Provider Note   CSN: [id number] Arrival date & time: 06/30/19  0249     History Chief Complaint  Patient presents with  . fatigue    Susan Austin is a 34 y.o. female.  HPI     This is a 34 year old female with history of DVT and pulmonary emboli, recent history of liposuction who presents with fatigue.  Patient reports that she had liposuction 1 week ago in [place].  She had a at an outpatient plastic surgery clinic.  Since that time she has developed generalized fatigue.  She was seen and evaluated on Saturday for chest pain and lightheadedness.  She is noted to have a hemoglobin of 7.7 at that time.  She believes that her hemoglobin prior to surgery was 13.  She states that she was offered admission but declined.  However, she has not improved.  She reports ongoing fatigue and "pain all over."  Specifically she reports pain in her arms and legs.  She has had ongoing issues with constipation since prior to surgery.  She reports that she did have 1 small bowel movement yesterday.  She reports abdominal and chest pain.  She was taking Percocet but does not like the side effects.  She states that NSAIDs do not help.  She denies any fevers or back pain.  Patient rates her discomfort a 10 out of 10.  Work-up reviewed from Saturday.  Negative PE study.  Hemoglobin was noted to be 7.7.  Past Medical History:  Diagnosis Date  . DVT (deep vein thrombosis) in pregnancy   . Pulmonary emboli [facility])     Patient Active Problem List   Diagnosis Date Noted  . Long term current use of anticoagulant 07/03/2010  . PE 09/26/2009    Past Surgical History:  Procedure Laterality Date  . CESAREAN SECTION       OB History    Gravida  5   Para  2   Term  2   Preterm      AB  2   Living  2     SAB  2   TAB      Ectopic      Multiple      Live Births              Family History  Problem Relation Age of Onset  . Alcohol  abuse Neg Hx   . Arthritis Neg Hx   . Asthma Neg Hx   . Birth defects Neg Hx   . Cancer Neg Hx   . COPD Neg Hx   . Depression Neg Hx   . Diabetes Neg Hx   . Drug abuse Neg Hx   . Early death Neg Hx   . Hearing loss Neg Hx   . Heart disease Neg Hx   . Hyperlipidemia Neg Hx   . Hypertension Neg Hx   . Kidney disease Neg Hx   . Learning disabilities Neg Hx   . Mental illness Neg Hx   . Mental retardation Neg Hx   . Miscarriages / Stillbirths Neg Hx   . Stroke Neg Hx   . Vision loss Neg Hx     Social History   Tobacco Use  . Smoking status: Never Smoker  . Smokeless tobacco: Never Used  Substance Use Topics  . Alcohol use: No  . Drug use: No    Home Medications Prior to Admission medications   Medication Sig Start Date End Date  Taking? Authorizing Provider  docusate sodium (COLACE) 100 MG capsule Take 1 capsule (100 mg total) by mouth every 12 (twelve) hours. 06/28/19   Caccavale, Sophia, PA-C  famotidine (PEPCID) 20 MG tablet Take 1 tablet (20 mg total) by mouth 2 (two) times daily. 06/28/19   Caccavale, Sophia, PA-C  ferrous sulfate 325 (65 FE) MG tablet Take 1 tablet (325 mg total) by mouth daily. 06/30/19   Rajanae Mantia, Mayer Masker, MD  naproxen (NAPROSYN) 500 MG tablet Take 1 tablet (500 mg total) by mouth 2 (two) times daily. 06/30/19   Vishal Sandlin, Mayer Masker, MD  phentermine 30 MG capsule Take by mouth.    [provider]  polyethylene glycol powder (MIRALAX) 17 GM/SCOOP powder Use 1-2 scoops of powder in 8 ounces of liquid up to 3 times a day until having regular bowel movements. 06/28/19   Caccavale, Sophia, PA-C    Allergies    Patient has no known allergies.  Review of Systems   Review of Systems  Constitutional: Positive for fatigue. Negative for fever.  Respiratory: Negative for shortness of breath.   Cardiovascular: Positive for chest pain. Negative for leg swelling.  Gastrointestinal: Positive for abdominal pain. Negative for diarrhea, nausea and vomiting.    Genitourinary: Negative for dysuria.  Musculoskeletal:       Bilateral arm and leg pain  Neurological: Negative for dizziness.  All other systems reviewed and are negative.   Physical Exam Updated Vital Signs BP 122/64   Pulse 98   Temp 98.3 F (36.8 C) (Oral)   Resp 15   Ht 1.6 m (5\' 3" )   Wt 86.2 kg   LMP 06/21/2019   SpO2 100%   BMI 33.66 kg/m   Physical Exam Vitals and nursing note reviewed.  Constitutional:      Appearance: She is well-developed. She is not ill-appearing.  HENT:     Head: Normocephalic and atraumatic.     Mouth/Throat:     Mouth: Mucous membranes are moist.  Eyes:     Pupils: Pupils are equal, round, and reactive to light.  Cardiovascular:     Rate and Rhythm: Normal rate and regular rhythm.     Heart sounds: Normal heart sounds.  Pulmonary:     Effort: Pulmonary effort is normal. No respiratory distress.     Breath sounds: No wheezing.  Abdominal:     General: Bowel sounds are normal.     Palpations: Abdomen is soft.     Comments: Abdominal binder in place, this was removed, surgical incisions clean dry and intact, diffuse tenderness to palpation, no overlying skin changes or significant ecchymosis noted  Musculoskeletal:     Cervical back: Neck supple.     Right lower leg: No edema.     Left lower leg: No edema.  Skin:    General: Skin is warm and dry.  Neurological:     Mental Status: She is alert and oriented to person, place, and time.  Psychiatric:        Mood and Affect: Mood normal.     ED Results / Procedures / Treatments   Labs (all labs ordered are listed, but only abnormal results are displayed) Labs Reviewed  CBC WITH DIFFERENTIAL/PLATELET - Abnormal; Notable for the following components:      Result Value   RBC 2.81 (*)    Hemoglobin 8.1 (*)    HCT 25.7 (*)    Platelets 430 (*)    nRBC 0.4 (*)    All other components within  normal limits  BASIC METABOLIC PANEL - Abnormal; Notable for the following components:    Potassium 3.4 (*)    Glucose, Bld 101 (*)    Calcium 8.4 (*)    All other components within normal limits    EKG None  Radiology CT Angio Chest PE W and/or Wo Contrast  Result Date: 06/28/2019 CLINICAL DATA:  Shortness of breath. EXAM: CT ANGIOGRAPHY CHEST WITH CONTRAST TECHNIQUE: Multidetector CT imaging of the chest was performed using the standard protocol during bolus administration of intravenous contrast. Multiplanar CT image reconstructions and MIPs were obtained to evaluate the vascular anatomy. CONTRAST:  69mL OMNIPAQUE IOHEXOL 350 MG/ML SOLN COMPARISON:  May 26, 2017 FINDINGS: Cardiovascular: Satisfactory opacification of the pulmonary arteries to the segmental level. No evidence of pulmonary embolism. Normal heart size. No pericardial effusion. Mediastinum/Nodes: No enlarged mediastinal, hilar, or axillary lymph nodes. Thyroid gland, trachea, and esophagus demonstrate no significant findings. Small hiatal hernia. Lungs/Pleura: Lungs are clear. No pleural effusion or pneumothorax. Upper Abdomen: No acute abnormality. Musculoskeletal: Diffuse chest wall subcutaneous stranding with multiple areas of subcutaneous emphysema, worse in the left upper posterior chest wall. No acute or significant osseous findings. Review of the MIP images confirms the above findings. IMPRESSION: 1. No evidence of pulmonary embolus. 2. Diffuse chest wall subcutaneous stranding with multiple areas of subcutaneous emphysema, worse in the left upper posterior chest wall, presumably postprocedural. Please correlate clinically for signs of cellulitis. Electronically Signed   By: Ted Mcalpine M.D.   On: 06/28/2019 18:35    Procedures Procedures (including critical care time)  Medications Ordered in ED Medications  sodium chloride 0.9 % bolus 1,000 mL (0 mLs Intravenous Stopped 06/30/19 0512)  fentaNYL (SUBLIMAZE) injection 50 mcg (50 mcg Intravenous Given 06/30/19 0415)  ondansetron (ZOFRAN) injection 4  mg (4 mg Intravenous Given 06/30/19 0404)  ketorolac (TORADOL) 30 MG/ML injection 30 mg (30 mg Intravenous Given 06/30/19 0440)    ED Course  I have reviewed the triage vital signs and the nursing notes.  Pertinent labs & imaging results that were available during my care of the patient were reviewed by me and considered in my medical decision making (see chart for details).  Clinical Course as of Jun 29 533  Mon Jun 30, 2019  5035 Patient somewhat improved on recheck.  Hemoglobin is stable to improving at 8.1.  Will give a dose of Toradol and continue to hydrate with fluids.  Discussed with her that there was not an indication for transfusion at this time.  She likely needs to start iron supplementation.  She will need to make sure to continue with bowel regimen with this.   [CH]    Clinical Course User Index [CH] Mariachristina Holle, Mayer Masker, MD   MDM Rules/Calculators/A&P                       Patient presents with multiple complaints.  Seen and evaluated on Saturday for the same.  She had a work-up to include a CT study that was negative for blood clots.  Recent liposuction procedure 1 week ago.  She is overall nontoxic.  Initial vital signs notable for tachycardia.  Abdominal incisions are clean dry and intact.  She has diffuse abdominal tenderness without signs of peritonitis.  Suspect this is likely normal postoperative pain.  I have reviewed her CT and work-up from Saturday.  Patient was given fluids and pain medication.  Repeat CBC with a hemoglobin of 8.1.  This is arguably stable  versus improving.  Given her age and lack of comorbidities, do not feel she has indications for transfusion.  Vital signs are reassuring.  She will likely need iron supplementation.  I discussed this with the patient.  She needs to continue her bowel regimen to prevent constipation.  I have also recommend starting an anti-inflammatory given ongoing generalized body aches and discomfort.  After history, exam, and  medical workup I feel the patient has been appropriately medically screened and is safe for discharge home. Pertinent diagnoses were discussed with the patient. Patient was given return precautions.   Final Clinical Impression(s) / ED Diagnoses Final diagnoses:  Post-op pain  Anemia, unspecified type    Rx / DC Orders ED Discharge Orders         Ordered    ferrous sulfate 325 (65 FE) MG tablet  Daily     06/30/19 0534    naproxen (NAPROSYN) 500 MG tablet  2 times daily     06/30/19 0534           Blayze Haen, Mayer Masker, MD 06/30/19 [phone]

## 2019-06-30 NOTE — ED Notes (Signed)
[place] given

## 2019-07-17 ENCOUNTER — Emergency Department (HOSPITAL_COMMUNITY): Payer: Medicaid Other

## 2019-07-17 ENCOUNTER — Emergency Department (HOSPITAL_COMMUNITY)
Admission: EM | Admit: 2019-07-17 | Discharge: 2019-07-18 | Disposition: A | Discharge status: Home / Self Care | Payer: Medicaid Other | Attending: Emergency Medicine | Admitting: Emergency Medicine

## 2019-07-17 ENCOUNTER — Other Ambulatory Visit: Payer: Self-pay

## 2019-07-17 ENCOUNTER — Encounter (HOSPITAL_COMMUNITY): Payer: Self-pay

## 2019-07-17 DIAGNOSIS — R079 Chest pain, unspecified: Secondary | ICD-10-CM | POA: Insufficient documentation

## 2019-07-17 DIAGNOSIS — R109 Unspecified abdominal pain: Secondary | ICD-10-CM | POA: Insufficient documentation

## 2019-07-17 DIAGNOSIS — Z7901 Long term (current) use of anticoagulants: Secondary | ICD-10-CM | POA: Insufficient documentation

## 2019-07-17 DIAGNOSIS — Z86711 Personal history of pulmonary embolism: Secondary | ICD-10-CM | POA: Diagnosis not present

## 2019-07-17 DIAGNOSIS — Z86718 Personal history of other venous thrombosis and embolism: Secondary | ICD-10-CM | POA: Insufficient documentation

## 2019-07-17 DIAGNOSIS — R1084 Generalized abdominal pain: Secondary | ICD-10-CM

## 2019-07-17 LAB — BASIC METABOLIC PANEL
Anion gap: 8 (ref 5–15)
BUN: 7 mg/dL (ref 6–20)
CO2: 23 mmol/L (ref 22–32)
Calcium: 9.4 mg/dL (ref 8.9–10.3)
Chloride: 107 mmol/L (ref 98–111)
Creatinine, Ser: 0.8 mg/dL (ref 0.44–1.00)
GFR calc Af Amer: >60 mL/min (ref >60)
GFR calc non Af Amer: >60 mL/min (ref >60)
Glucose, Bld: 94 mg/dL (ref 70–99)
Potassium: 3.8 mmol/L (ref 3.5–5.1)
Sodium: 138 mmol/L (ref 135–145)

## 2019-07-17 LAB — I-STAT BETA HCG BLOOD, ED (MC, WL, AP ONLY): I-stat hCG, quantitative: <5 m[IU]/mL (ref <5)

## 2019-07-17 LAB — D-DIMER, QUANTITATIVE: D-Dimer, Quant: 4.9 ug/mL-FEU — ABNORMAL HIGH (ref 0.00–0.50)

## 2019-07-17 LAB — CBC
HCT: 33.4 % — ABNORMAL LOW (ref 36.0–46.0)
Hemoglobin: 10.2 g/dL — ABNORMAL LOW (ref 12.0–15.0)
MCH: 27.1 pg (ref 26.0–34.0)
MCHC: 30.5 g/dL (ref 30.0–36.0)
MCV: 88.8 fL (ref 80.0–100.0)
Platelets: 517 10*3/uL — ABNORMAL HIGH (ref 150–400)
RBC: 3.76 MIL/uL — ABNORMAL LOW (ref 3.87–5.11)
RDW: 14.7 % (ref 11.5–15.5)
WBC: 4.7 10*3/uL (ref 4.0–10.5)
nRBC: 0 % (ref 0.0–0.2)

## 2019-07-17 LAB — TROPONIN I (HIGH SENSITIVITY)
Troponin I (High Sensitivity): <2 ng/L (ref <18)
Troponin I (High Sensitivity): <2 ng/L (ref <18)

## 2019-07-17 MED ORDER — SODIUM CHLORIDE 0.9% FLUSH: 3.0000 mL | Freq: Once | INTRAVENOUS | Status: DC

## 2019-07-17 MED ORDER — IOHEXOL 350 MG/ML SOLN
100.0000 mL | Freq: Once | INTRAVENOUS | Status: AC | PRN
  Administered 2019-07-17: 100 mL via INTRAVENOUS

## 2019-07-17 NOTE — ED Provider Notes (Signed)
[facility] Provider Note   CSN: [id number] Arrival date & time: 07/17/19  1946     History Chief Complaint  Patient presents with   Chest Pain   Abdominal Pain    Susan Austin is a 34 y.o. female with past medical history significant for DVT, PE presenting to emergency room today with chief complaint of chest pain x 3 days and abdominal pain x 1 week. Patient had liposuction in [place] on 06/26/2019 at an outpatient surgery clinic. She is describing the chest pain is located in the center of her chest.  She states it is intermittent.  She describes as a sharp stabbing sensation. Pain does not radiate.  Pain is not worse with movement.  She states the pain is only present when she is lying flat on her stomach.  She states she sleeps on her stomach because she also had liposuction of her hips and is unable to lay comfortably on her back.  She rates the pain 4/10 in severity.  She is also endorsing intermittent shortness of breath.  Denies dyspnea on exertion.  Patient was found to have low hemoglobin and started taking iron supplements.  She states she is still taking those as prescribed. She is describing her abdominal pain is located in bilateral lower quadrants.  She states it feels like she has fluid shifting in her abdomen.  She is currently seeing a massage therapist who told her she had a seroma.  Patient is describing the abdominal pain as an aching sensation.  It has been constant. She denies any fever, chills, cough, hemoptysis,  back pain, gross hematuria, urinary frequency, diarrhea, blood in stool, exogenous estrogen use, lower extremity pain or swelling.  Patient states she has a history of blood clots while taking birth control.  She was anticoagulated for 6 months and then it was discontinued, this was in 2012.   Of note patient has been seen in ED x2 in the last month. One visit for chest pain and shortness of breath on 06/28/2019  and had negative PE study, hemoglobin 7.7, offered admission decline. Second visit 06/30/2019 for fatigue with hemoglobin 8.1, discharged with iron supplements and anti-inflammatories.   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    Pulmonary emboli [facility](HCC)     Patient Active Problem List   Diagnosis Date Noted   Long term current use of anticoagulant 07/03/2010   PE 09/26/2009    Past Surgical History:  Procedure Laterality Date   CESAREAN SECTION       OB History    Gravida  5   Para  2   Term  2   Preterm      AB  2   Living  2     SAB  2   TAB      Ectopic      Multiple      Live Births              Family History  Problem Relation Age of Onset   Alcohol abuse Neg Hx    Arthritis Neg Hx    Asthma Neg Hx    Birth defects Neg Hx    Cancer Neg Hx    COPD Neg Hx    Depression Neg Hx    Diabetes Neg Hx    Drug abuse Neg Hx    Early death Neg Hx    Hearing loss Neg Hx  Heart disease Neg Hx    Hyperlipidemia Neg Hx    Hypertension Neg Hx    Kidney disease Neg Hx    Learning disabilities Neg Hx    Mental illness Neg Hx    Mental retardation Neg Hx    Miscarriages / Stillbirths Neg Hx    Stroke Neg Hx    Vision loss Neg Hx     Social History   Tobacco Use   Smoking status: Never Smoker   Smokeless tobacco: Never Used  Substance Use Topics   Alcohol use: No   Drug use: No    Home Medications Prior to Admission medications   Medication Sig Start Date End Date Taking? Authorizing Provider  ciprofloxacin (CIPRO) 500 MG tablet Take 500 mg by mouth 2 (two) times daily.   Yes [provider]  clindamycin (CLEOCIN) 300 MG capsule Take 300 mg by mouth 2 (two) times daily.   Yes [provider]  ferrous sulfate 325 (65 FE) MG tablet Take 1 tablet (325 mg total) by mouth daily. 06/30/19  Yes Horton, Barbette Hair, MD  ibuprofen (ADVIL) 800 MG tablet Take 800 mg by  mouth 3 (three) times daily as needed for moderate pain.  07/02/19  Yes [provider]  docusate sodium (COLACE) 100 MG capsule Take 1 capsule (100 mg total) by mouth every 12 (twelve) hours. Patient not taking: Reported on 07/17/2019 06/28/19   Caccavale, Sophia, PA-C  enoxaparin (LOVENOX) 40 MG/0.4ML injection Inject 40 mg into the skin daily. 06/25/19   [provider]  famotidine (PEPCID) 20 MG tablet Take 1 tablet (20 mg total) by mouth 2 (two) times daily. Patient not taking: Reported on 07/17/2019 06/28/19   Caccavale, Sophia, PA-C  naproxen (NAPROSYN) 500 MG tablet Take 1 tablet (500 mg total) by mouth 2 (two) times daily. Patient not taking: Reported on 07/17/2019 06/30/19   Horton, Barbette Hair, MD  polyethylene glycol powder (MIRALAX) 17 GM/SCOOP powder Use 1-2 scoops of powder in 8 ounces of liquid up to 3 times a day until having regular bowel movements. Patient not taking: Reported on 07/17/2019 06/28/19   Caccavale, Sophia, PA-C    Allergies    Patient has no known allergies.  Review of Systems   Review of Systems All other systems are reviewed and are negative for acute change except as noted in the HPI.  Physical Exam Updated Vital Signs BP 135/79 (BP Location: Left Arm)    Pulse 92    Temp 98.5 F (36.9 C) (Oral)    Resp 18    Ht 5\' 3"  (1.6 m)    Wt 88.5 kg    LMP 06/21/2019    SpO2 100%    BMI 34.54 kg/m   Physical Exam Vitals and nursing note reviewed.  Constitutional:      General: She is not in acute distress.    Appearance: She is not ill-appearing.  HENT:     Head: Normocephalic and atraumatic.     Right Ear: Tympanic membrane and external ear normal.     Left Ear: Tympanic membrane and external ear normal.     Nose: Nose normal.     Mouth/Throat:     Mouth: Mucous membranes are moist.     Pharynx: Oropharynx is clear.  Eyes:     General: No scleral icterus.       Right eye: No discharge.        Left eye: No discharge.     Extraocular Movements:  Extraocular movements  intact.     Conjunctiva/sclera: Conjunctivae normal.     Pupils: Pupils are equal, round, and reactive to light.  Neck:     Vascular: No JVD.  Cardiovascular:     Rate and Rhythm: Normal rate and regular rhythm.     Pulses: Normal pulses.          Radial pulses are 2+ on the right side and 2+ on the left side.     Heart sounds: Normal heart sounds.  Pulmonary:     Comments: Lungs clear to auscultation in all fields. Symmetric chest rise. No wheezing, rales, or rhonchi. Abdominal:     Tenderness: There is no right CVA tenderness or left CVA tenderness.     Comments: Abdomen is soft, non-distended. Generalized abdominal tenderness. No rigidity, no guarding. No peritoneal signs. Normoactive bowel sounds  Musculoskeletal:        General: Normal range of motion.     Cervical back: Normal range of motion.     Comments: Homans sign absent bilaterally, no lower extremity edema, no palpable cords, compartments are soft  Skin:    General: Skin is warm and dry.     Capillary Refill: Capillary refill takes less than 2 seconds.  Neurological:     Mental Status: She is oriented to person, place, and time.     GCS: GCS eye subscore is 4. GCS verbal subscore is 5. GCS motor subscore is 6.     Comments: Fluent speech, no facial droop.  Psychiatric:        Behavior: Behavior normal.     ED Results / Procedures / Treatments   Labs (all labs ordered are listed, but only abnormal results are displayed) Labs Reviewed  CBC - Abnormal; Notable for the following components:      Result Value   RBC 3.76 (*)    Hemoglobin 10.2 (*)    HCT 33.4 (*)    Platelets 517 (*)    All other components within normal limits  D-DIMER, QUANTITATIVE (NOT AT [facility]) - Abnormal; Notable for the following components:   D-Dimer, Quant 4.90 (*)    All other components within normal limits  BASIC METABOLIC PANEL  I-STAT BETA HCG BLOOD, ED ([facility], [facility], [facility] ONLY)  TROPONIN I (HIGH SENSITIVITY)  TROPONIN  I (HIGH SENSITIVITY)    EKG EKG Interpretation  Date/Time:  Thursday July 17 2019 19:52:43 EST Ventricular Rate:  93 PR Interval:  144 QRS Duration: 80 QT Interval:  346 QTC Calculation: 430 R Axis:   54 Text Interpretation: Normal sinus rhythm no sig change from previous Confirmed by Arby Barrette [phone]) on 07/17/2019 11:44:21 PM   Radiology CT Angio Chest PE W/Cm &/Or Wo Cm  Result Date: 07/17/2019 CLINICAL DATA:  Abdominal pain. Abdominal distention. Recent liposuction. Shortness of breath with elevated D-dimer and history of PE in 2012. EXAM: CT ANGIOGRAPHY CHEST CT ABDOMEN AND PELVIS WITH CONTRAST TECHNIQUE: Multidetector CT imaging of the chest was performed using the standard protocol during bolus administration of intravenous contrast. Multiplanar CT image reconstructions and MIPs were obtained to evaluate the vascular anatomy. Multidetector CT imaging of the abdomen and pelvis was performed using the standard protocol during bolus administration of intravenous contrast. CONTRAST:  OMNIPAQUE IOHEXOL 350 MG/ML SOLN COMPARISON:  None. FINDINGS: CTA CHEST FINDINGS Cardiovascular: Evaluation was limited by artifact from the patient's right arm.Given this limitation, no PE was identified. The main pulmonary artery is within normal limits for size. There is no CT evidence of acute right  heart strain. The visualized aorta is normal. Heart size is normal, without pericardial effusion. Mediastinum/Nodes: --No mediastinal or hilar lymphadenopathy. --No axillary lymphadenopathy. --No supraclavicular lymphadenopathy. --Normal thyroid gland. --The esophagus is unremarkable Lungs/Pleura: No pulmonary nodules or masses. No pleural effusion or pneumothorax. No focal airspace consolidation. No focal pleural abnormality. Musculoskeletal: There is no acute displaced fracture. There is diffuse subcutaneous fat stranding involving the subcutaneous soft tissues of the thorax, likely related to the  patient's history of recent liposuction. There is no evidence for abscess or drainable fluid collection. Review of the MIP images confirms the above findings. CT ABDOMEN and PELVIS FINDINGS Hepatobiliary: The liver is normal. Normal gallbladder.There is no biliary ductal dilation. Pancreas: Normal contours without ductal dilatation. No peripancreatic fluid collection. Spleen: No splenic laceration or hematoma. Adrenals/Urinary Tract: --Adrenal glands: No adrenal hemorrhage. --Right kidney/ureter: No hydronephrosis or perinephric hematoma. --Left kidney/ureter: No hydronephrosis or perinephric hematoma. --Urinary bladder: Unremarkable. Stomach/Bowel: --Stomach/Duodenum: No hiatal hernia or other gastric abnormality. Normal duodenal course and caliber. --Small bowel: No dilatation or inflammation. --Colon: No focal abnormality. --Appendix: Normal. Vascular/Lymphatic: Normal course and caliber of the major abdominal vessels. --No retroperitoneal lymphadenopathy. --No mesenteric lymphadenopathy. --No pelvic or inguinal lymphadenopathy. Reproductive: Unremarkable Other: There is diffuse subcutaneous fat stranding involving the anterior and posterior abdominal wall. In the low anterior abdominal wall there is a 1.8 by 21 by 6.1 cm fluid collection centered within the subcutaneous fat. There are no pockets of gas within this fluid collection. This collection extends superiorly along the patient's bilateral flanks, right worse than left. There is no abdominal wall hernia. Musculoskeletal. No acute displaced fractures. Review of the MIP images confirms the above findings. IMPRESSION: 1. No acute pulmonary embolism. 2. No acute intrathoracic abnormality. 3. Findings detailed above likely related to the patient's recent liposuction. There is a large fluid collection involving the low anterior abdominal wall. Differential considerations include a postoperative seroma or hematoma within abscess not excluded. 4. Normal appendix  in the right lower quadrant. Electronically Signed   By: Katherine Mantle M.D.   On: 07/17/2019 22:22   CT ABDOMEN PELVIS W CONTRAST  Result Date: 07/17/2019 CLINICAL DATA:  Abdominal pain. Abdominal distention. Recent liposuction. Shortness of breath with elevated D-dimer and history of PE in 2012. EXAM: CT ANGIOGRAPHY CHEST CT ABDOMEN AND PELVIS WITH CONTRAST TECHNIQUE: Multidetector CT imaging of the chest was performed using the standard protocol during bolus administration of intravenous contrast. Multiplanar CT image reconstructions and MIPs were obtained to evaluate the vascular anatomy. Multidetector CT imaging of the abdomen and pelvis was performed using the standard protocol during bolus administration of intravenous contrast. CONTRAST:  OMNIPAQUE IOHEXOL 350 MG/ML SOLN COMPARISON:  None. FINDINGS: CTA CHEST FINDINGS Cardiovascular: Evaluation was limited by artifact from the patient's right arm.Given this limitation, no PE was identified. The main pulmonary artery is within normal limits for size. There is no CT evidence of acute right heart strain. The visualized aorta is normal. Heart size is normal, without pericardial effusion. Mediastinum/Nodes: --No mediastinal or hilar lymphadenopathy. --No axillary lymphadenopathy. --No supraclavicular lymphadenopathy. --Normal thyroid gland. --The esophagus is unremarkable Lungs/Pleura: No pulmonary nodules or masses. No pleural effusion or pneumothorax. No focal airspace consolidation. No focal pleural abnormality. Musculoskeletal: There is no acute displaced fracture. There is diffuse subcutaneous fat stranding involving the subcutaneous soft tissues of the thorax, likely related to the patient's history of recent liposuction. There is no evidence for abscess or drainable fluid collection. Review of the MIP images confirms the above  findings. CT ABDOMEN and PELVIS FINDINGS Hepatobiliary: The liver is normal. Normal gallbladder.There is no biliary  ductal dilation. Pancreas: Normal contours without ductal dilatation. No peripancreatic fluid collection. Spleen: No splenic laceration or hematoma. Adrenals/Urinary Tract: --Adrenal glands: No adrenal hemorrhage. --Right kidney/ureter: No hydronephrosis or perinephric hematoma. --Left kidney/ureter: No hydronephrosis or perinephric hematoma. --Urinary bladder: Unremarkable. Stomach/Bowel: --Stomach/Duodenum: No hiatal hernia or other gastric abnormality. Normal duodenal course and caliber. --Small bowel: No dilatation or inflammation. --Colon: No focal abnormality. --Appendix: Normal. Vascular/Lymphatic: Normal course and caliber of the major abdominal vessels. --No retroperitoneal lymphadenopathy. --No mesenteric lymphadenopathy. --No pelvic or inguinal lymphadenopathy. Reproductive: Unremarkable Other: There is diffuse subcutaneous fat stranding involving the anterior and posterior abdominal wall. In the low anterior abdominal wall there is a 1.8 by 21 by 6.1 cm fluid collection centered within the subcutaneous fat. There are no pockets of gas within this fluid collection. This collection extends superiorly along the patient's bilateral flanks, right worse than left. There is no abdominal wall hernia. Musculoskeletal. No acute displaced fractures. Review of the MIP images confirms the above findings. IMPRESSION: 1. No acute pulmonary embolism. 2. No acute intrathoracic abnormality. 3. Findings detailed above likely related to the patient's recent liposuction. There is a large fluid collection involving the low anterior abdominal wall. Differential considerations include a postoperative seroma or hematoma within abscess not excluded. 4. Normal appendix in the right lower quadrant. Electronically Signed   By: Katherine Mantle M.D.   On: 07/17/2019 22:22    Procedures Procedures (including critical care time)  Medications Ordered in ED Medications  sodium chloride flush (NS) 0.9 % injection 3 mL (3 mLs  Intravenous Not Given 07/17/19 2041)  iohexol (OMNIPAQUE) 350 MG/ML injection 100 mL (100 mLs Intravenous Contrast Given 07/17/19 2142)    ED Course  I have reviewed the triage vital signs and the nursing notes.  Pertinent labs & imaging results that were available during my care of the patient were reviewed by me and considered in my medical decision making (see chart for details).    MDM Rules/Calculators/A&P                     Patient seen and examined. Patient presents awake, alert, hemodynamically stable, afebrile, non toxic. She is well-appearing, resting comfortably on stretcher.  Lungs are clear to auscultation all fields, no hypoxia.  She has generalized abdominal tenderness, no peritoneal signs.  No lower extremity edema, negative Homans' sign bilaterally. EKG without stemi, no ischemic changes.  Labs collected in triage. I viewed results which are significant for elevated d dimer at 4.9, hemoglobin of 10.2 which appears improved from prior visits (7.7, 8.1). Pregnancy test is negative. Delta troponin flat. No leukocytosis. No severe electrolyte derangement or renal insufficieny.  Given patient's risk factors and elevated d dimer, will proceed with CTA chest.  CTA chest is negative for PE does shows a fluid collection. There is diffuse subcutaneous fat stranding involving the subcutaneous soft tissues of the thorax without evidence for abscess or drainable fluid collection. CT AP does show a large fluid collection in the low anterior abdominal wall there is a 1.8 by 21 by 6.1 cm fluid collection centered within the subcutaneous fat. Patient will need to follow up with plastic surgery for further evaluation of abdominal pain and distention.  On reassessment patient is well appearing, does not appear septic, has reassuring labs. Discussed with ED attending Dr. Donnald Garre who agrees with plan for outpatient plastic surgery follow up.  The patient appears reasonably screened and/or stabilized  for discharge and I doubt any other medical condition or other [facility] requiring further screening, evaluation, or treatment in the ED at this time prior to discharge. The patient is safe for discharge with strict return precautions discussed. Findings and plan of care discussed with supervising physician Dr. Donnald Garre.    Portions of this note were generated with Scientist, clinical (histocompatibility and immunogenetics). Dictation errors may occur despite best attempts at proofreading.  Final Clinical Impression(s) / ED Diagnoses Final diagnoses:  Generalized abdominal pain  Chest pain, unspecified type    Rx / DC Orders ED Discharge Orders    None       Sherene Sires, PA-C 07/18/19 0033    Arby Barrette, MD 07/21/19 [phone]

## 2019-07-17 NOTE — ED Notes (Signed)
Pt updated on POC, now awaiting for general surgeon consultation. Pt verbalized understanding. Pt oriented about the importance of continue monitoring of vitals sign, pt removing BP cuff, placed back on her.

## 2019-07-17 NOTE — ED Notes (Signed)
Pt requesting to be remove from the tele monitor states she want to put her binder back do to recent surgery, pt oriented that while she is on the ED she will need to keep monitor lead on for vital sign monitor, pt states she will place her abd binder over the leads.

## 2019-07-17 NOTE — ED Triage Notes (Signed)
Pt arrives POV for eval of chest pain and abd swelling/discomfort since having liposuction in [place] on 1/14. Pt reports she feels "fluid moving around in her abd". Endorses worsening CP since tha time and worse w/ laying down. Reports int SOB

## 2019-07-17 NOTE — ED Notes (Signed)
Pt refusing to have chest x ray done.

## 2019-07-18 NOTE — Discharge Instructions (Addendum)
You have been seen today for chest pain and abdominal pain. Please read and follow all provided instructions. Return to the emergency room for worsening condition or new concerning symptoms.    The CT scan of your abdomen today shows a fluid collection. You do not have signs of infection but should have this evaluated further as we discussed.  The CT scan of your chest did not show a blood clot.   1. Medications:  Continue usual home medications Take medications as prescribed. Please review all of the medicines and only take them if you do not have an allergy to them.   2. Treatment: rest, drink plenty of fluids  3. Follow Up:  Please follow up with plastic surgeon Dr. Foster Simpson.  I have given you her contact information above.  Please call her office tomorrow to schedule a follow-up appointment for evaluation of your abdominal pain.   ?

## 2020-08-09 ENCOUNTER — Emergency Department (HOSPITAL_COMMUNITY): Payer: Medicaid Other

## 2020-08-09 ENCOUNTER — Observation Stay (HOSPITAL_COMMUNITY)
Admission: EM | Admit: 2020-08-09 | Discharge: 2020-08-10 | Disposition: A | Discharge status: Home Health | Payer: Medicaid Other | Attending: Family Medicine | Admitting: Family Medicine

## 2020-08-09 ENCOUNTER — Other Ambulatory Visit: Payer: Self-pay

## 2020-08-09 ENCOUNTER — Emergency Department (HOSPITAL_BASED_OUTPATIENT_CLINIC_OR_DEPARTMENT_OTHER)
Admit: 2020-08-09 | Discharge: 2020-08-09 | Disposition: A | Discharge status: Home / Self Care | Payer: Medicaid Other | Attending: Emergency Medicine | Admitting: Emergency Medicine

## 2020-08-09 ENCOUNTER — Encounter (HOSPITAL_COMMUNITY): Payer: Self-pay

## 2020-08-09 ENCOUNTER — Encounter (HOSPITAL_COMMUNITY): Payer: Self-pay | Admitting: Radiology

## 2020-08-09 DIAGNOSIS — I82402 Acute embolism and thrombosis of unspecified deep veins of left lower extremity: Secondary | ICD-10-CM | POA: Diagnosis not present

## 2020-08-09 DIAGNOSIS — Z79899 Other long term (current) drug therapy: Secondary | ICD-10-CM | POA: Insufficient documentation

## 2020-08-09 DIAGNOSIS — O2231 Deep phlebothrombosis in pregnancy, first trimester: Secondary | ICD-10-CM | POA: Diagnosis not present

## 2020-08-09 DIAGNOSIS — M79605 Pain in left leg: Secondary | ICD-10-CM

## 2020-08-09 DIAGNOSIS — Z3A01 Less than 8 weeks gestation of pregnancy: Secondary | ICD-10-CM | POA: Insufficient documentation

## 2020-08-09 DIAGNOSIS — I2699 Other pulmonary embolism without acute cor pulmonale: Secondary | ICD-10-CM | POA: Diagnosis not present

## 2020-08-09 DIAGNOSIS — I2694 Multiple subsegmental pulmonary emboli without acute cor pulmonale: Secondary | ICD-10-CM

## 2020-08-09 DIAGNOSIS — I82409 Acute embolism and thrombosis of unspecified deep veins of unspecified lower extremity: Secondary | ICD-10-CM | POA: Diagnosis present

## 2020-08-09 DIAGNOSIS — R0602 Shortness of breath: Secondary | ICD-10-CM | POA: Insufficient documentation

## 2020-08-09 DIAGNOSIS — Z20822 Contact with and (suspected) exposure to covid-19: Secondary | ICD-10-CM | POA: Diagnosis not present

## 2020-08-09 DIAGNOSIS — O88811 Other embolism in pregnancy, first trimester: Secondary | ICD-10-CM | POA: Diagnosis not present

## 2020-08-09 DIAGNOSIS — I82462 Acute embolism and thrombosis of left calf muscular vein: Secondary | ICD-10-CM | POA: Diagnosis not present

## 2020-08-09 DIAGNOSIS — Z86711 Personal history of pulmonary embolism: Secondary | ICD-10-CM

## 2020-08-09 DIAGNOSIS — Z3490 Encounter for supervision of normal pregnancy, unspecified, unspecified trimester: Secondary | ICD-10-CM

## 2020-08-09 DIAGNOSIS — Z7901 Long term (current) use of anticoagulants: Secondary | ICD-10-CM | POA: Insufficient documentation

## 2020-08-09 DIAGNOSIS — Z3A00 Weeks of gestation of pregnancy not specified: Secondary | ICD-10-CM

## 2020-08-09 DIAGNOSIS — Z86718 Personal history of other venous thrombosis and embolism: Secondary | ICD-10-CM

## 2020-08-09 DIAGNOSIS — I82452 Acute embolism and thrombosis of left peroneal vein: Secondary | ICD-10-CM | POA: Diagnosis present

## 2020-08-09 DIAGNOSIS — O88211 Thromboembolism in pregnancy, first trimester: Secondary | ICD-10-CM | POA: Diagnosis present

## 2020-08-09 LAB — CBC WITH DIFFERENTIAL/PLATELET
Abs Immature Granulocytes: 0.02 10*3/uL (ref 0.00–0.07)
Basophils Absolute: 0 10*3/uL (ref 0.0–0.1)
Basophils Relative: 0 %
Eosinophils Absolute: 0.2 10*3/uL (ref 0.0–0.5)
Eosinophils Relative: 3 %
HCT: 42.2 % (ref 36.0–46.0)
Hemoglobin: 14 g/dL (ref 12.0–15.0)
Immature Granulocytes: 0 %
Lymphocytes Relative: 43 %
Lymphs Abs: 2.4 10*3/uL (ref 0.7–4.0)
MCH: 29.1 pg (ref 26.0–34.0)
MCHC: 33.2 g/dL (ref 30.0–36.0)
MCV: 87.7 fL (ref 80.0–100.0)
Monocytes Absolute: 0.3 10*3/uL (ref 0.1–1.0)
Monocytes Relative: 6 %
Neutro Abs: 2.6 10*3/uL (ref 1.7–7.7)
Neutrophils Relative %: 48 %
Platelets: 309 10*3/uL (ref 150–400)
RBC: 4.81 MIL/uL (ref 3.87–5.11)
RDW: 12.4 % (ref 11.5–15.5)
WBC: 5.5 10*3/uL (ref 4.0–10.5)
nRBC: 0 % (ref 0.0–0.2)

## 2020-08-09 LAB — TROPONIN I (HIGH SENSITIVITY): Troponin I (High Sensitivity): <2 ng/L (ref <18)

## 2020-08-09 LAB — BASIC METABOLIC PANEL
Anion gap: 11 (ref 5–15)
BUN: 8 mg/dL (ref 6–20)
CO2: 21 mmol/L — ABNORMAL LOW (ref 22–32)
Calcium: 9.1 mg/dL (ref 8.9–10.3)
Chloride: 105 mmol/L (ref 98–111)
Creatinine, Ser: 0.62 mg/dL (ref 0.44–1.00)
GFR, Estimated: >60 mL/min (ref >60)
Glucose, Bld: 87 mg/dL (ref 70–99)
Potassium: 3.6 mmol/L (ref 3.5–5.1)
Sodium: 137 mmol/L (ref 135–145)

## 2020-08-09 LAB — RESP PANEL BY RT-PCR (FLU A&B, COVID) ARPGX2
Influenza A by PCR: NEGATIVE
Influenza B by PCR: NEGATIVE
SARS Coronavirus 2 by RT PCR: NEGATIVE

## 2020-08-09 LAB — HCG, QUANTITATIVE, PREGNANCY: hCG, Beta Chain, Quant, S: 10972 m[IU]/mL — ABNORMAL HIGH (ref <5)

## 2020-08-09 LAB — I-STAT BETA HCG BLOOD, ED (NOT ORDERABLE): I-stat hCG, quantitative: >2000 m[IU]/mL — ABNORMAL HIGH (ref <5)

## 2020-08-09 LAB — BRAIN NATRIURETIC PEPTIDE: B Natriuretic Peptide: 72.1 pg/mL (ref 0.0–100.0)

## 2020-08-09 MED ORDER — ONDANSETRON HCL 4 MG/2ML IJ SOLN
4.0000 mg | Freq: Four times a day (QID) | INTRAMUSCULAR | Status: DC | PRN
  Administered 2020-08-09 – 2020-08-10 (×2): 4 mg via INTRAVENOUS
  Filled 2020-08-09 (×3): qty 2

## 2020-08-09 MED ORDER — ACETAMINOPHEN 650 MG RE SUPP: 650.0000 mg | Freq: Four times a day (QID) | RECTAL | Status: DC | PRN

## 2020-08-09 MED ORDER — MORPHINE SULFATE (PF) 2 MG/ML IV SOLN
2.0000 mg | INTRAVENOUS | Status: DC | PRN
  Administered 2020-08-09 – 2020-08-10 (×5): 2 mg via INTRAVENOUS
  Filled 2020-08-09 (×5): qty 1

## 2020-08-09 MED ORDER — ENOXAPARIN SODIUM 100 MG/ML ~~LOC~~ SOLN
90.0000 mg | Freq: Once | SUBCUTANEOUS | Status: AC
  Administered 2020-08-09: 90 mg via SUBCUTANEOUS
  Filled 2020-08-09: qty 0.9

## 2020-08-09 MED ORDER — ONDANSETRON HCL 4 MG PO TABS: 4.0000 mg | ORAL_TABLET | Freq: Four times a day (QID) | ORAL | Status: DC | PRN

## 2020-08-09 MED ORDER — ACETAMINOPHEN 325 MG PO TABS
650.0000 mg | ORAL_TABLET | Freq: Once | ORAL | Status: AC
  Administered 2020-08-09: 650 mg via ORAL
  Filled 2020-08-09: qty 2

## 2020-08-09 MED ORDER — DEXTROSE-NACL 5-0.45 % IV SOLN: INTRAVENOUS | Status: DC

## 2020-08-09 MED ORDER — ENOXAPARIN SODIUM 100 MG/ML ~~LOC~~ SOLN
1.0000 mg/kg | Freq: Two times a day (BID) | SUBCUTANEOUS | Status: DC
  Administered 2020-08-10 (×2): 90 mg via SUBCUTANEOUS
  Filled 2020-08-09 (×2): qty 1

## 2020-08-09 MED ORDER — ACETAMINOPHEN 325 MG PO TABS
650.0000 mg | ORAL_TABLET | Freq: Four times a day (QID) | ORAL | Status: DC | PRN
  Administered 2020-08-10: 650 mg via ORAL
  Filled 2020-08-09: qty 2

## 2020-08-09 MED ORDER — IOHEXOL 350 MG/ML SOLN
100.0000 mL | Freq: Once | INTRAVENOUS | Status: AC | PRN
  Administered 2020-08-09: 80 mL via INTRAVENOUS

## 2020-08-09 NOTE — Progress Notes (Signed)
Left lower extremity venous duplex has been completed. Preliminary results can be found in CV Proc through chart review.  Results were given to Dr. Rush Landmark.  08/09/20 4:02 PM Olen Cordial RVT

## 2020-08-09 NOTE — ED Notes (Signed)
Per Claudette Stapler, PA pt may eat and drink at this time.

## 2020-08-09 NOTE — ED Notes (Signed)
Patient transported to CT 

## 2020-08-09 NOTE — ED Provider Notes (Signed)
[facility]-EMERGENCY DEPT Provider Note   CSN: [id number] Arrival date & time: 08/09/20  1422     History Chief Complaint  Patient presents with  . Left Leg Numbness    Susan Austin is a 35 y.o. 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  Patient states calf pain feels similar to past DVT.  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  Patient denies recent surgeries, recent long immobilizations, hormonal treatments, and hemoptysis.  Admits to slight edema of the left lower extremity.  She also admits to associated shortness of breath.  Denies chest pain.  Shortness of breath worse with exertion.  Denies injury to left leg.  Denies fever and chills.  Denies recent Covid infection.  She has been taking ibuprofen with moderate relief.  History obtained from patient and past medical records. No interpreter used during encounter.      Past Medical History:  Diagnosis Date  . DVT (deep vein thrombosis) in pregnancy   . Pulmonary emboli [facility])     Patient Active Problem List   Diagnosis Date Noted  . DVT (deep venous thrombosis) (HCC) 08/09/2020  . Long term current use of anticoagulant 07/03/2010  . PE 09/26/2009    Past Surgical History:  Procedure Laterality Date  . CESAREAN SECTION       OB History    Gravida  6   Para  2   Term  2   Preterm      AB  2   Living  2     SAB  2   IAB      Ectopic      Multiple      Live Births              Family History  Problem Relation Age of Onset  . Alcohol abuse Neg Hx   . Arthritis Neg Hx   . Asthma Neg Hx   . Birth defects Neg Hx   . Cancer Neg Hx   . COPD Neg Hx   . Depression Neg Hx   . Diabetes Neg Hx   . Drug abuse Neg Hx   . Early death Neg Hx   . Hearing loss Neg Hx    . Heart disease Neg Hx   . Hyperlipidemia Neg Hx   . Hypertension Neg Hx   . Kidney disease Neg Hx   . Learning disabilities Neg Hx   . Mental illness Neg Hx   . Mental retardation Neg Hx   . Miscarriages / Stillbirths Neg Hx   . Stroke Neg Hx   . Vision loss Neg Hx     Social History   Tobacco Use  . Smoking status: Never Smoker  . Smokeless tobacco: Never Used  Vaping Use  . Vaping Use: Never used  Substance Use Topics  . Alcohol use: No  . Drug use: No    Home Medications Prior to Admission medications   Medication Sig Start Date End Date Taking? Authorizing Provider  ciprofloxacin (CIPRO) 500 MG tablet Take 500 mg by mouth 2 (two) times daily.    [provider]  clindamycin (CLEOCIN) 300 MG capsule Take 300 mg by mouth 2 (two) times daily.    [provider]  docusate sodium (COLACE) 100  MG capsule Take 1 capsule (100 mg total) by mouth every 12 (twelve) hours. Patient not taking: Reported on 07/17/2019 06/28/19   Caccavale, Sophia, PA-C  enoxaparin (LOVENOX) 40 MG/0.4ML injection Inject 40 mg into the skin daily. 06/25/19   [provider]  famotidine (PEPCID) 20 MG tablet Take 1 tablet (20 mg total) by mouth 2 (two) times daily. Patient not taking: Reported on 07/17/2019 06/28/19   Caccavale, Sophia, PA-C  ferrous sulfate 325 (65 FE) MG tablet Take 1 tablet (325 mg total) by mouth daily. 06/30/19   Horton, Mayer Masker, MD  ibuprofen (ADVIL) 800 MG tablet Take 800 mg by mouth 3 (three) times daily as needed for moderate pain.  07/02/19   [provider]  naproxen (NAPROSYN) 500 MG tablet Take 1 tablet (500 mg total) by mouth 2 (two) times daily. Patient not taking: Reported on 07/17/2019 06/30/19   Horton, Mayer Masker, MD  polyethylene glycol powder (MIRALAX) 17 GM/SCOOP powder Use 1-2 scoops of powder in 8 ounces of liquid up to 3 times a day until having regular bowel movements. Patient not taking: Reported on 07/17/2019 06/28/19   Caccavale,  Sophia, PA-C    Allergies    Patient has no known allergies.  Review of Systems   Review of Systems  Constitutional: Negative for chills and fever.  Respiratory: Positive for shortness of breath.   Cardiovascular: Positive for leg swelling. Negative for chest pain.  Musculoskeletal: Positive for myalgias.  All other systems reviewed and are negative.   Physical Exam Updated Vital Signs BP (!) 141/89   Pulse 73   Temp 98.9 F (37.2 C) (Oral)   Resp 18   Ht 5\' 3"  (1.6 m)   Wt 90.7 kg   SpO2 98%   BMI 35.43 kg/m   Physical Exam Vitals and nursing note reviewed.  Constitutional:      General: She is not in acute distress.    Appearance: She is not ill-appearing.  HENT:     Head: Normocephalic.  Eyes:     Pupils: Pupils are equal, round, and reactive to light.  Cardiovascular:     Rate and Rhythm: Normal rate and regular rhythm.     Pulses: Normal pulses.     Heart sounds: Normal heart sounds. No murmur heard. No friction rub. No gallop.   Pulmonary:     Effort: Pulmonary effort is normal.     Breath sounds: Normal breath sounds.  Abdominal:     General: Abdomen is flat. There is no distension.     Palpations: Abdomen is soft.     Tenderness: There is no abdominal tenderness. There is no guarding or rebound.  Musculoskeletal:     Cervical back: Neck supple.     Comments: Tenderness throughout left calf. Mild edema. LLE neurovascularly intact. Full ROM of left knee and ankle.   Skin:    General: Skin is warm and dry.  Neurological:     General: No focal deficit present.  Psychiatric:        Mood and Affect: Mood normal.        Behavior: Behavior normal.     ED Results / Procedures / Treatments   Labs (all labs ordered are listed, but only abnormal results are displayed) Labs Reviewed  BASIC METABOLIC PANEL - Abnormal; Notable for the following components:      Result Value   CO2 21 (*)    All other components within normal limits  HCG, QUANTITATIVE,  PREGNANCY - Abnormal; Notable  for the following components:   hCG, Beta Chain, Quant, S 10,972 (*)    All other components within normal limits  RESP PANEL BY RT-PCR (FLU A&B, COVID) ARPGX2  CBC WITH DIFFERENTIAL/PLATELET  BRAIN NATRIURETIC PEPTIDE  I-STAT BETA HCG BLOOD, ED ([facility], [facility], [facility] ONLY)  TROPONIN I (HIGH SENSITIVITY)    EKG None  Radiology CT Angio Chest PE W and/or Wo Contrast  Result Date: 08/09/2020 CLINICAL DATA:  Left leg numbness 2 days EXAM: CT ANGIOGRAPHY CHEST WITH CONTRAST TECHNIQUE: Multidetector CT imaging of the chest was performed using the standard protocol during bolus administration of intravenous contrast. Multiplanar CT image reconstructions and MIPs were obtained to evaluate the vascular anatomy. CONTRAST:  80mL OMNIPAQUE IOHEXOL 350 MG/ML SOLN COMPARISON:  July 27, 2019 FINDINGS: Cardiovascular: There is a optimal opacification of the pulmonary arteries. There is partially occlusive thrombus seen in the anterior left upper lobe and posterior left lower lobe subsegmental arterial branches. There is also partially occlusive thrombus in the posterior right lower lobe subsegmental branch. No central filling defects are seen. The heart is normal in size. No pericardial effusion or thickening. No evidence right heart strain. There is normal three-vessel brachiocephalic anatomy without proximal stenosis. The thoracic aorta is normal in appearance. Mediastinum/Nodes: No hilar, mediastinal, or axillary adenopathy. Thyroid gland, trachea, and esophagus demonstrate no significant findings. Lungs/Pleura: The lungs are clear. No pleural effusion or pneumothorax. No airspace consolidation. Upper Abdomen: No acute abnormalities present in the visualized portions of the upper abdomen. Musculoskeletal: No chest wall abnormality. No acute or significant osseous findings. Review of the MIP images confirms the above findings. IMPRESSION: Bilateral partially occlusive thrombi within the  subsegmental branches as described above. No central pulmonary embolism. No evidence of right ventricular heart strain. These results were called by telephone at the time of interpretation on 08/09/2020 at 9:34 pm to provider [facility] , who verbally acknowledged these results. Electronically Signed   By: Jonna ClarkBindu  Avutu M.D.   On: 08/09/2020 21:34   VAS US LOWER EXTREMITY VENOUS (DVT) ([facility] and [facility] 7a-7p)  Result Date: 08/09/2020  Lower Venous DVT Study Indications: Pain.  Limitations: Poor ultrasound/tissue interface and body habitus. Comparison Study: No prior studies. Performing Technologist: Chanda BusingGregory Collins RVT  Examination Guidelines: A complete evaluation includes B-mode imaging, spectral Doppler, color Doppler, and power Doppler as needed of all accessible portions of each vessel. Bilateral testing is considered an integral part of a complete examination. Limited examinations for reoccurring indications may be performed as noted. The reflux portion of the exam is performed with the patient in reverse Trendelenburg.  +-----+---------------+---------+-----------+----------+--------------+ RIGHTCompressibilityPhasicitySpontaneityPropertiesThrombus Aging +-----+---------------+---------+-----------+----------+--------------+ CFV  Full           Yes      Yes                                 +-----+---------------+---------+-----------+----------+--------------+   +---------+---------------+---------+-----------+----------+--------------+ LEFT     CompressibilityPhasicitySpontaneityPropertiesThrombus Aging +---------+---------------+---------+-----------+----------+--------------+ CFV      Full           Yes      Yes                                 +---------+---------------+---------+-----------+----------+--------------+ SFJ      Full                                                        +---------+---------------+---------+-----------+----------+--------------+  FV Prox   Full                                                        +---------+---------------+---------+-----------+----------+--------------+ FV Mid   Full                                                        +---------+---------------+---------+-----------+----------+--------------+ FV DistalFull                                                        +---------+---------------+---------+-----------+----------+--------------+ PFV      Full                                                        +---------+---------------+---------+-----------+----------+--------------+ POP      Full           Yes      Yes                                 +---------+---------------+---------+-----------+----------+--------------+ PTV      Full                                                        +---------+---------------+---------+-----------+----------+--------------+ PERO     None                                         Acute          +---------+---------------+---------+-----------+----------+--------------+     Summary: RIGHT: - No evidence of common femoral vein obstruction.  LEFT: - Findings consistent with acute deep vein thrombosis involving the left peroneal veins. - No cystic structure found in the popliteal fossa.  *See table(s) above for measurements and observations. Electronically signed by Lemar Livings MD on 08/09/2020 at 5:51:04 PM.    Final     Procedures .Critical Care Performed by: Mannie Stabile, PA-C Authorized by: Mannie Stabile, PA-C   Critical care provider statement:    Critical care time (minutes):  45   Critical care time was exclusive of:  Separately billable procedures and treating other patients and teaching time   Critical care was necessary to treat or prevent imminent or life-threatening deterioration of the following conditions:  Respiratory failure and cardiac failure   Critical care was time spent personally by me on the following  activities:  Discussions with consultants, evaluation of patient's response to treatment, examination of patient, ordering and performing treatments and interventions, ordering and review of laboratory studies, ordering and  review of radiographic studies, pulse oximetry, re-evaluation of patient's condition, obtaining history from patient or surrogate and review of old charts   I assumed direction of critical care for this patient from another provider in my specialty: no     Care discussed with: admitting provider       Medications Ordered in ED Medications  acetaminophen (TYLENOL) tablet 650 mg (650 mg Oral Given 08/09/20 1542)  enoxaparin (LOVENOX) injection 90 mg (90 mg Subcutaneous Given 08/09/20 2057)  iohexol (OMNIPAQUE) 350 MG/ML injection 100 mL (80 mLs Intravenous Contrast Given 08/09/20 2109)    ED Course  I have reviewed the triage vital signs and the nursing notes.  Pertinent labs & imaging results that were available during my care of the patient were reviewed by me and considered in my medical decision making (see chart for details).  Clinical Course as of 08/09/20 2148  Mon Aug 09, 2020  1856 Informed by RN that patient's hcg is >2000; however, has not crossed over. Quantitative hcg ordered [CA]  2032 Spoke to [place] with pharmacy who recommends Lovenox 90 mg twice daily for 3 months.  First dose of Lovenox given here in the ED. [CA]  2102 HCG, Beta Chain, Quant, S(!): 10,972 [CA]    Clinical Course User Index [CA] Mannie Stabile, PA-C   MDM Rules/Calculators/A&P                         35 year old female presents to the ED due to left lower extremity numbness/tingling and calf pain.  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  Per patient, previous DVT related to birth control.  She is not currently on any hormonal treatments.  Recent surgeries, recent long immobilizations, and hemoptysis.  Upon arrival, stable vitals.  Patient is afebrile, not  tachycardic or hypoxic.  Patient in no acute distress and non-ill-appearing.  Physical exam significant for tenderness throughout left calf with mild edema.  Distal pulses and sensation intact.  Ultrasound ordered to rule out DVT given patient's history and calf tenderness.  Routine labs ordered to check renal function.  Tylenol given for pain management.  [place] personally reviewed which demonstrates: Summary:  RIGHT:  - No evidence of common femoral vein obstruction.    LEFT:  - Findings consistent with acute deep vein thrombosis involving the left peroneal veins.  - No cystic structure found in the popliteal fossa.   Given patient is having some shortness of breath, CTA chest ordered to rule out PE. I-STAT hcg >2000. I had a long conversation with patient in regards to radiation in early pregnancy and the possible risks. I also discussed with radiologist who recommends performing CTA chest if suspicion is high for PE. Patient voiced understanding of the risks and would like to have CTA chest to rule out PE. Discussed case with Dr. Stevie Kern who evaluated patient at bedside and also discussed the risks of radiation in early pregnancy. Patient also notes she does not want to move forward with pregnancy. BNP and troponin normal. Low suspicion for right heart strain.   CTA chest personally reviewed which demonstrates: IMPRESSION:  Bilateral partially occlusive thrombi within the subsegmental  branches as described above. No central pulmonary embolism.    No evidence of right ventricular heart strain.  COVID test ordered.   Discussed case with Dr. Mikeal Hawthorne with [facility] who agrees to admit patient for further treatment. COVID test ordered.  Final Clinical Impression(s) / ED Diagnoses Final  diagnoses:  Acute pulmonary embolism without acute cor pulmonale, unspecified pulmonary embolism type [facility])    Rx / DC Orders ED Discharge Orders    None       Jesusita Oka 08/09/20 2148     Milagros Loll, MD 08/14/20 [phone]

## 2020-08-09 NOTE — ED Notes (Signed)
Report given to Felicia, RN

## 2020-08-09 NOTE — ED Notes (Signed)
Per Claudette Stapler, PA second troponin lab draw is not needed.

## 2020-08-09 NOTE — ED Triage Notes (Signed)
Pt c/o left leg numbness x2 days. Pt states she has a hx of blood clots, in her left and and lung. Pt states this feels similar to her previous blood clot. Pt states left leg is numb from her thigh to foot, with tingling in left foot.

## 2020-08-09 NOTE — H&P (Signed)
History and Physical   Susan Austin MHD:[id number] DOB: 1985/08/10 DOA: 08/09/2020  Referring MD/NP/PA: Dr. Lynelle Doctor  PCP: Norm Salt, [place]   Outpatient Specialists: None  Patient coming from: Home  Chief Complaint: Left leg numbness  HPI: Susan Austin is a 35 y.o. 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  She came off of that.  Her previous DVTs and PEs were thought to be secondary to exposure to birth control pills.  Patient came in with left lower extremity numbness and tingling around the calf for 2 days.  It felt like her previous DVT.  No recent surgeries.  She has not been on any birth control pills.  Patient was found to be pregnant which she did not know.  Suspected probably first trimester.  Also found to have DVTs as well as bilateral PEs.  She received a dose of Lovenox and is being admitted to the hospital for evaluation..  ED Course: Temperature 98.9 blood pressure is 150/96 pulse 88 respirate 18 oxygen sat 98% on room air.  CBC and chemistry entirely within normal. Beta-hCG more than 10,000 troponin low.  COVID-19 pending.  CT angiogram of the chest showed bilateral partially occlusive thrombi within the subsegmental branches.  No central pulmonary embolism.  No evidence of right ventricular heart strain.  Doppler ultrasound showed left-sided DVT involving the left peroneal veins.  Patient being admitted to the hospital for evaluation and treatment.  Review of Systems: As per HPI otherwise 10 point review of systems negative.    Past Medical History:  Diagnosis Date  . DVT (deep vein thrombosis) in pregnancy   . Pulmonary emboli [facility])     Past Surgical History:  Procedure Laterality Date  . CESAREAN SECTION       reports that she has never smoked. She has never used smokeless tobacco. She reports that she does not drink alcohol and does not use drugs.  No Known  Allergies  Family History  Problem Relation Age of Onset  . Alcohol abuse Neg Hx   . Arthritis Neg Hx   . Asthma Neg Hx   . Birth defects Neg Hx   . Cancer Neg Hx   . COPD Neg Hx   . Depression Neg Hx   . Diabetes Neg Hx   . Drug abuse Neg Hx   . Early death Neg Hx   . Hearing loss Neg Hx   . Heart disease Neg Hx   . Hyperlipidemia Neg Hx   . Hypertension Neg Hx   . Kidney disease Neg Hx   . Learning disabilities Neg Hx   . Mental illness Neg Hx   . Mental retardation Neg Hx   . Miscarriages / Stillbirths Neg Hx   . Stroke Neg Hx   . Vision loss Neg Hx      Prior to Admission medications   Medication Sig Start Date End Date Taking? Authorizing Provider  ciprofloxacin (CIPRO) 500 MG tablet Take 500 mg by mouth 2 (two) times daily.    [provider]  clindamycin (CLEOCIN) 300 MG capsule Take 300 mg by mouth 2 (two) times daily.    [provider]  docusate sodium (COLACE) 100 MG capsule Take 1 capsule (100 mg total) by mouth every 12 (twelve) hours. Patient not taking: Reported on 07/17/2019 06/28/19   Caccavale, Sophia, PA-C  enoxaparin (LOVENOX) 40 MG/0.4ML injection Inject 40 mg into the skin  daily. 06/25/19   [provider]  famotidine (PEPCID) 20 MG tablet Take 1 tablet (20 mg total) by mouth 2 (two) times daily. Patient not taking: Reported on 07/17/2019 06/28/19   Caccavale, Sophia, PA-C  ferrous sulfate 325 (65 FE) MG tablet Take 1 tablet (325 mg total) by mouth daily. 06/30/19   Horton, Mayer Maskerourtney F, MD  ibuprofen (ADVIL) 800 MG tablet Take 800 mg by mouth 3 (three) times daily as needed for moderate pain.  07/02/19   [provider]  naproxen (NAPROSYN) 500 MG tablet Take 1 tablet (500 mg total) by mouth 2 (two) times daily. Patient not taking: Reported on 07/17/2019 06/30/19   Horton, Mayer Maskerourtney F, MD  polyethylene glycol powder (MIRALAX) 17 GM/SCOOP powder Use 1-2 scoops of powder in 8 ounces of liquid up to 3 times a day until having regular  bowel movements. Patient not taking: Reported on 07/17/2019 06/28/19   Alveria ApleyCaccavale, Sophia, PA-C    Physical Exam: Vitals:   08/09/20 1428 08/09/20 1429 08/09/20 1741 08/09/20 2000  BP: (!) 152/96  (!) 149/77 (!) 141/89  Pulse: 88  80 73  Resp: 18  18 18   Temp: 98.9 F (37.2 C)     TempSrc: Oral     SpO2: 98%  98% 98%  Weight:  90.7 kg    Height:  5\' 3"  (1.6 m)        Constitutional: Anxious no distress Vitals:   08/09/20 1428 08/09/20 1429 08/09/20 1741 08/09/20 2000  BP: (!) 152/96  (!) 149/77 (!) 141/89  Pulse: 88  80 73  Resp: 18  18 18   Temp: 98.9 F (37.2 C)     TempSrc: Oral     SpO2: 98%  98% 98%  Weight:  90.7 kg    Height:  5\' 3"  (1.6 m)     Eyes: PERRL, lids and conjunctivae normal ENMT: Mucous membranes are moist. Posterior pharynx clear of any exudate or lesions.Normal dentition.  Neck: normal, supple, no masses, no thyromegaly Respiratory: clear to auscultation bilaterally, no wheezing, no crackles. Normal respiratory effort. No accessory muscle use.  Cardiovascular: Regular rate and rhythm, no murmurs / rubs / gallops. No extremity edema. 2+ pedal pulses. No carotid bruits.  Abdomen: no tenderness, no masses palpated. No hepatosplenomegaly. Bowel sounds positive.  Musculoskeletal: no clubbing / cyanosis. No joint deformity upper and lower extremities. Good ROM, no contractures. Normal muscle tone.  Left calf tenderness Skin: no rashes, lesions, ulcers. No induration Neurologic: CN 2-12 grossly intact. Sensation intact, DTR normal. Strength 5/5 in all 4.  Psychiatric: Normal judgment and insight. Alert and oriented x 3.  Anxious mood.     Labs on Admission: I have personally reviewed following labs and imaging studies  CBC: Recent Labs  Lab 08/09/20 1605  WBC 5.5  NEUTROABS 2.6  HGB 14.0  HCT 42.2  MCV 87.7  PLT 309   Basic Metabolic Panel: Recent Labs  Lab 08/09/20 1605  NA 137  K 3.6  CL 105  CO2 21*  GLUCOSE 87  BUN 8  CREATININE 0.62   CALCIUM 9.1   GFR: Estimated Creatinine Clearance: 107.9 mL/min (by C-G formula based on SCr of 0.62 mg/dL). Liver Function Tests: No results for input(s): AST, ALT, ALKPHOS, BILITOT, PROT, ALBUMIN in the last 168 hours. No results for input(s): LIPASE, AMYLASE in the last 168 hours. No results for input(s): AMMONIA in the last 168 hours. Coagulation Profile: No results for input(s): INR, PROTIME in the last 168 hours. Cardiac Enzymes:  No results for input(s): CKTOTAL, CKMB, CKMBINDEX, TROPONINI in the last 168 hours. BNP (last 3 results) No results for input(s): PROBNP in the last 8760 hours. HbA1C: No results for input(s): HGBA1C in the last 72 hours. CBG: No results for input(s): GLUCAP in the last 168 hours. Lipid Profile: No results for input(s): CHOL, HDL, LDLCALC, TRIG, CHOLHDL, LDLDIRECT in the last 72 hours. Thyroid Function Tests: No results for input(s): TSH, T4TOTAL, FREET4, T3FREE, THYROIDAB in the last 72 hours. Anemia Panel: No results for input(s): VITAMINB12, FOLATE, FERRITIN, TIBC, IRON, RETICCTPCT in the last 72 hours. Urine analysis:    Component Value Date/Time   COLORURINE YELLOW 04/12/2014 1204   APPEARANCEUR CLOUDY (A) 04/12/2014 1204   LABSPEC 1.014 04/12/2014 1204   PHURINE 7.0 04/12/2014 1204   GLUCOSEU NEGATIVE 04/12/2014 1204   GLUCOSEU NEG mg/dL 16/03/9603 5409   HGBUR NEGATIVE 04/12/2014 1204   BILIRUBINUR NEGATIVE 04/12/2014 1204   KETONESUR NEGATIVE 04/12/2014 1204   PROTEINUR NEGATIVE 04/12/2014 1204   UROBILINOGEN 1.0 04/12/2014 1204   NITRITE NEGATIVE 04/12/2014 1204   LEUKOCYTESUR SMALL (A) 04/12/2014 1204   Sepsis Labs: (procalcitonin:4,lacticidven:4) )No results found for this or any previous visit (from the past 240 hour(s)).   Radiological Exams on Admission: CT Angio Chest PE W and/or Wo Contrast  Result Date: 08/09/2020 CLINICAL DATA:  Left leg numbness 2 days EXAM: CT ANGIOGRAPHY CHEST WITH CONTRAST TECHNIQUE:  Multidetector CT imaging of the chest was performed using the standard protocol during bolus administration of intravenous contrast. Multiplanar CT image reconstructions and MIPs were obtained to evaluate the vascular anatomy. CONTRAST:  80mL OMNIPAQUE IOHEXOL 350 MG/ML SOLN COMPARISON:  July 27, 2019 FINDINGS: Cardiovascular: There is a optimal opacification of the pulmonary arteries. There is partially occlusive thrombus seen in the anterior left upper lobe and posterior left lower lobe subsegmental arterial branches. There is also partially occlusive thrombus in the posterior right lower lobe subsegmental branch. No central filling defects are seen. The heart is normal in size. No pericardial effusion or thickening. No evidence right heart strain. There is normal three-vessel brachiocephalic anatomy without proximal stenosis. The thoracic aorta is normal in appearance. Mediastinum/Nodes: No hilar, mediastinal, or axillary adenopathy. Thyroid gland, trachea, and esophagus demonstrate no significant findings. Lungs/Pleura: The lungs are clear. No pleural effusion or pneumothorax. No airspace consolidation. Upper Abdomen: No acute abnormalities present in the visualized portions of the upper abdomen. Musculoskeletal: No chest wall abnormality. No acute or significant osseous findings. Review of the MIP images confirms the above findings. IMPRESSION: Bilateral partially occlusive thrombi within the subsegmental branches as described above. No central pulmonary embolism. No evidence of right ventricular heart strain. These results were called by telephone at the time of interpretation on 08/09/2020 at 9:34 pm to provider [facility] , who verbally acknowledged these results. Electronically Signed   By: Jonna Clark M.D.   On: 08/09/2020 21:34   VAS [place] LOWER EXTREMITY VENOUS (DVT) ([facility] and [facility] 7a-7p)  Result Date: 08/09/2020  Lower Venous DVT Study Indications: Pain.  Limitations: Poor ultrasound/tissue  interface and body habitus. Comparison Study: No prior studies. Performing Technologist: Chanda Busing RVT  Examination Guidelines: A complete evaluation includes B-mode imaging, spectral Doppler, color Doppler, and power Doppler as needed of all accessible portions of each vessel. Bilateral testing is considered an integral part of a complete examination. Limited examinations for reoccurring indications may be performed as noted. The reflux portion of the exam is performed with the patient in reverse Trendelenburg.  +-----+---------------+---------+-----------+----------+--------------+ RIGHTCompressibilityPhasicitySpontaneityPropertiesThrombus  Aging +-----+---------------+---------+-----------+----------+--------------+ CFV  Full           Yes      Yes                                 +-----+---------------+---------+-----------+----------+--------------+   +---------+---------------+---------+-----------+----------+--------------+ LEFT     CompressibilityPhasicitySpontaneityPropertiesThrombus Aging +---------+---------------+---------+-----------+----------+--------------+ CFV      Full           Yes      Yes                                 +---------+---------------+---------+-----------+----------+--------------+ SFJ      Full                                                        +---------+---------------+---------+-----------+----------+--------------+ FV Prox  Full                                                        +---------+---------------+---------+-----------+----------+--------------+ FV Mid   Full                                                        +---------+---------------+---------+-----------+----------+--------------+ FV DistalFull                                                        +---------+---------------+---------+-----------+----------+--------------+ PFV      Full                                                         +---------+---------------+---------+-----------+----------+--------------+ POP      Full           Yes      Yes                                 +---------+---------------+---------+-----------+----------+--------------+ PTV      Full                                                        +---------+---------------+---------+-----------+----------+--------------+ PERO     None                                         Acute          +---------+---------------+---------+-----------+----------+--------------+  Summary: RIGHT: - No evidence of common femoral vein obstruction.  LEFT: - Findings consistent with acute deep vein thrombosis involving the left peroneal veins. - No cystic structure found in the popliteal fossa.  *See table(s) above for measurements and observations. Electronically signed by Lemar Livings MD on 08/09/2020 at 5:51:04 PM.    Final     EKG: Independently reviewed.  No significant ST changes.  She has sinus tachycardia  Assessment/Plan Principal Problem:   Pulmonary embolism (HCC) Active Problems:   DVT (deep venous thrombosis) (HCC)   Pregnancy     #1  Bilateral pulmonary emboli: Recurrent.  Patient informed she needs to been on anticoagulation for the rest of her life.  With her being pregnant which will use Lovenox.  No right heart strain.  Echo probably not indicated.  Once patient is able to be set up with outpatient Lovenox she may be discharged home.  #2 left lower extremity DVT: As per above.  Continue with Lovenox  #3 first trimester pregnancy: Counseling provided.  Patient just found out today.  She is contemplating not keeping the pregnancy.   DVT prophylaxis: Lovenox therapeutic Code Status: Full code Family Communication: Significant other in the room Disposition Plan: Home Consults called: None Admission status: Observation  Severity of Illness: The appropriate patient status for this patient is OBSERVATION. Observation status is  judged to be reasonable and necessary in order to provide the required intensity of service to ensure the patient's safety. The patient's presenting symptoms, physical exam findings, and initial radiographic and laboratory data in the context of their medical condition is felt to place them at decreased risk for further clinical deterioration. Furthermore, it is anticipated that the patient will be medically stable for discharge from the hospital within 2 midnights of admission. The following factors support the patient status of observation.   " The patient's presenting symptoms include left lower extremity DVT. " The physical exam findings include tenderness in the calf. " The initial radiographic and laboratory data are bilateral PE.     Margues Filippini,LAWAL MD [facility] Pager [phone]  If 7PM-7AM, please contact night-coverage [email or web address] Password [facility]  08/09/2020, 10:30 PM

## 2020-08-10 DIAGNOSIS — I2694 Multiple subsegmental pulmonary emboli without acute cor pulmonale: Secondary | ICD-10-CM | POA: Diagnosis not present

## 2020-08-10 LAB — CBC
HCT: 35.7 % — ABNORMAL LOW (ref 36.0–46.0)
Hemoglobin: 12.1 g/dL (ref 12.0–15.0)
MCH: 29.5 pg (ref 26.0–34.0)
MCHC: 33.9 g/dL (ref 30.0–36.0)
MCV: 87.1 fL (ref 80.0–100.0)
Platelets: 277 10*3/uL (ref 150–400)
RBC: 4.1 MIL/uL (ref 3.87–5.11)
RDW: 12.5 % (ref 11.5–15.5)
WBC: 6.2 10*3/uL (ref 4.0–10.5)
nRBC: 0 % (ref 0.0–0.2)

## 2020-08-10 LAB — COMPREHENSIVE METABOLIC PANEL
ALT: 13 U/L (ref 0–44)
AST: 15 U/L (ref 15–41)
Albumin: 3.6 g/dL (ref 3.5–5.0)
Alkaline Phosphatase: 48 U/L (ref 38–126)
Anion gap: 8 (ref 5–15)
BUN: 8 mg/dL (ref 6–20)
CO2: 23 mmol/L (ref 22–32)
Calcium: 8.7 mg/dL — ABNORMAL LOW (ref 8.9–10.3)
Chloride: 106 mmol/L (ref 98–111)
Creatinine, Ser: 0.66 mg/dL (ref 0.44–1.00)
GFR, Estimated: >60 mL/min (ref >60)
Glucose, Bld: 93 mg/dL (ref 70–99)
Potassium: 3.2 mmol/L — ABNORMAL LOW (ref 3.5–5.1)
Sodium: 137 mmol/L (ref 135–145)
Total Bilirubin: 0.9 mg/dL (ref 0.3–1.2)
Total Protein: 6.7 g/dL (ref 6.5–8.1)

## 2020-08-10 LAB — HIV ANTIBODY (ROUTINE TESTING W REFLEX): HIV Screen 4th Generation wRfx: NONREACTIVE

## 2020-08-10 MED ORDER — ENOXAPARIN (LOVENOX) PATIENT EDUCATION KIT
PACK | Freq: Once | Status: AC
  Filled 2020-08-10: qty 1

## 2020-08-10 MED ORDER — POTASSIUM CHLORIDE CRYS ER 20 MEQ PO TBCR
40.0000 meq | EXTENDED_RELEASE_TABLET | Freq: Every day | ORAL | Status: DC
  Administered 2020-08-10: 40 meq via ORAL
  Filled 2020-08-10: qty 2

## 2020-08-10 MED ORDER — POTASSIUM CHLORIDE CRYS ER 20 MEQ PO TBCR: 40.0000 meq | EXTENDED_RELEASE_TABLET | Freq: Every day | ORAL | 0 refills | Status: DC

## 2020-08-10 MED ORDER — TRAMADOL HCL 50 MG PO TABS: 50.0000 mg | ORAL_TABLET | Freq: Four times a day (QID) | ORAL | 0 refills | Status: DC | PRN

## 2020-08-10 MED ORDER — ENOXAPARIN (LOVENOX) PATIENT EDUCATION KIT: 1.0000 | PACK | Freq: Once | Status: AC

## 2020-08-10 MED ORDER — ENOXAPARIN SODIUM 100 MG/ML ~~LOC~~ SOLN: 1.0000 mg/kg | Freq: Two times a day (BID) | SUBCUTANEOUS | 60 refills | Status: DC

## 2020-08-10 NOTE — Progress Notes (Signed)
SATURATION QUALIFICATIONS: (This note is used to comply with regulatory documentation for home oxygen)  Patient Saturations on Room Air at Rest 96%  Patient Saturations on Room Air while Ambulating 100%  Patient Saturations on 0 Liters of oxygen while Ambulating N/A  Please briefly explain why patient needs home oxygen: Not applicable,  Pt ambulates without dypnea and complete ADL's without distress.  SRP, RN

## 2020-08-10 NOTE — Progress Notes (Signed)
RESP panel/COVID results negative--[employer]IRborne/Contact PPE discontinued.

## 2020-08-10 NOTE — Progress Notes (Signed)
Pt c/o left leg discomfort 8/10 Morphine and Tylendol given @ 1256. Circumfence: Lt. leg 38 cm and Rt. leg 37 cm MD updated.

## 2020-08-10 NOTE — Discharge Summary (Addendum)
Physician Discharge Summary  Lewayne BuntingChristine L Linford ZOX:[id number] DOB: 01/29/1986 DOA: 08/09/2020  PCP: Norm SaltVanstory, Ashley N, PA  Admit date: 08/09/2020 Discharge date: 08/10/2020  Time spent: 30 minutes  Recommendations for Outpatient Follow-up:  1. 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 If patient undergoes abortion-May then qualify for mechanical thrombectomy-she will call Dr. Arbie CookeyEarly to set up appointment 3. Needs CBC differential 1 week 4. 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 etc. I discussed her case with Dr. Arbie CookeyEarly of vascular surgery who recommended that if she does not keep the baby then she would be a reasonable candidate for mechanical thrombectomy and have provided the patient with his number She may require further outpatient for other etiologies to her recurrent DVTs  Consultations:  Telephone consulted vascular surgeon  Discharge Exam: Vitals:   08/10/20 1056 08/10/20 1333  BP: 134/70 (!) 133/95  Pulse: 77 71  Resp: 18 18  Temp: 98.6 F (37 C) 98.6 F (37 C)  SpO2:  98%    Subj on day of d/c   Doing well 5/10 pain in left lower  extremity but ambulatory No chest pain no fever   General Exam on discharge   alert coherent no distress EOMI NCAT no focal deficit S1-S2 no murmur no rub no gallop Chest clear no added sound no rales no rhonchi Abdomen soft no rebound no guarding Neurologically intact no focal deficit   Discharge Instructions   Discharge Instructions    Diet - low sodium heart healthy   Complete by: As directed    Discharge instructions   Complete by: As directed    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 Arbie CookeyEarly in the outpatient setting to ensure that you have an opinion regarding the swelling in your leg and what ever procedures he can perform Take care   Increase activity slowly   Complete by: As directed      Allergies as of 08/10/2020   No Known Allergies     Medication List    STOP taking these medications   ibuprofen 800 MG tablet Commonly known as: ADVIL     TAKE these medications   enoxaparin 100 MG/ML injection Commonly known as: LOVENOX Inject 0.9 mLs (90 mg total) into the skin every 12 (twelve) hours.   potassium chloride SA 20 MEQ tablet Commonly  known as: KLOR-CON Take 2 tablets (40 mEq total) by mouth daily. Start taking on: August 11, 2020   traMADol 50 MG tablet Commonly known as: Ultram Take 1 tablet (50 mg total) by mouth every 6 (six) hours as needed.      No Known Allergies    The results of significant diagnostics from this hospitalization (including imaging, microbiology, ancillary and laboratory) are listed below for reference.    Significant Diagnostic Studies: CT Angio Chest PE W and/or Wo Contrast  Result  Date: 08/09/2020 CLINICAL DATA:  Left leg numbness 2 days EXAM: CT ANGIOGRAPHY CHEST WITH CONTRAST TECHNIQUE: Multidetector CT imaging of the chest was performed using the standard protocol during bolus administration of intravenous contrast. Multiplanar CT image reconstructions and MIPs were obtained to evaluate the vascular anatomy. CONTRAST:  38mL OMNIPAQUE IOHEXOL 350 MG/ML SOLN COMPARISON:  July 27, 2019 FINDINGS: Cardiovascular: There is a optimal opacification of the pulmonary arteries. There is partially occlusive thrombus seen in the anterior left upper lobe and posterior left lower lobe subsegmental arterial branches. There is also partially occlusive thrombus in the posterior right lower lobe subsegmental branch. No central filling defects are seen. The heart is normal in size. No pericardial effusion or thickening. No evidence right heart strain. There is normal three-vessel brachiocephalic anatomy without proximal stenosis. The thoracic aorta is normal in appearance. Mediastinum/Nodes: No hilar, mediastinal, or axillary adenopathy. Thyroid gland, trachea, and esophagus demonstrate no significant findings. Lungs/Pleura: The lungs are clear. No pleural effusion or pneumothorax. No airspace consolidation. Upper Abdomen: No acute abnormalities present in the visualized portions of the upper abdomen. Musculoskeletal: No chest wall abnormality. No acute or significant osseous findings. Review of the MIP images confirms the above findings. IMPRESSION: Bilateral partially occlusive thrombi within the subsegmental branches as described above. No central pulmonary embolism. No evidence of right ventricular heart strain. These results were called by telephone at the time of interpretation on 08/09/2020 at 9:34 pm to provider [facility] , who verbally acknowledged these results. Electronically Signed   By: Jonna Clark M.D.   On: 08/09/2020 21:34   VAS [place] LOWER EXTREMITY VENOUS (DVT) ([facility] and [facility]  7a-7p)  Result Date: 08/09/2020  Lower Venous DVT Study Indications: Pain.  Limitations: Poor ultrasound/tissue interface and body habitus. Comparison Study: No prior studies. Performing Technologist: Chanda Busing RVT  Examination Guidelines: A complete evaluation includes B-mode imaging, spectral Doppler, color Doppler, and power Doppler as needed of all accessible portions of each vessel. Bilateral testing is considered an integral part of a complete examination. Limited examinations for reoccurring indications may be performed as noted. The reflux portion of the exam is performed with the patient in reverse Trendelenburg.  +-----+---------------+---------+-----------+----------+--------------+ RIGHTCompressibilityPhasicitySpontaneityPropertiesThrombus Aging +-----+---------------+---------+-----------+----------+--------------+ CFV  Full           Yes      Yes                                 +-----+---------------+---------+-----------+----------+--------------+   +---------+---------------+---------+-----------+----------+--------------+ LEFT     CompressibilityPhasicitySpontaneityPropertiesThrombus Aging +---------+---------------+---------+-----------+----------+--------------+ CFV      Full           Yes      Yes                                 +---------+---------------+---------+-----------+----------+--------------+ SFJ      Full                                                        +---------+---------------+---------+-----------+----------+--------------+  FV Prox  Full                                                        +---------+---------------+---------+-----------+----------+--------------+ FV Mid   Full                                                        +---------+---------------+---------+-----------+----------+--------------+ FV DistalFull                                                         +---------+---------------+---------+-----------+----------+--------------+ PFV      Full                                                        +---------+---------------+---------+-----------+----------+--------------+ POP      Full           Yes      Yes                                 +---------+---------------+---------+-----------+----------+--------------+ PTV      Full                                                        +---------+---------------+---------+-----------+----------+--------------+ PERO     None                                         Acute          +---------+---------------+---------+-----------+----------+--------------+     Summary: RIGHT: - No evidence of common femoral vein obstruction.  LEFT: - Findings consistent with acute deep vein thrombosis involving the left peroneal veins. - No cystic structure found in the popliteal fossa.  *See table(s) above for measurements and observations. Electronically signed by Lemar Livings MD on 08/09/2020 at 5:51:04 PM.    Final     Microbiology: Recent Results (from the past 240 hour(s))  Resp Panel by RT-PCR (Flu A&B, Covid) Nasopharyngeal Swab     Status: None   Collection Time: 08/09/20  9:55 PM   Specimen: Nasopharyngeal Swab; Nasopharyngeal(NP) swabs in vial transport medium  Result Value Ref Range Status   SARS Coronavirus 2 by RT PCR NEGATIVE NEGATIVE Final    Comment: (NOTE) SARS-CoV-2 target nucleic acids are NOT DETECTED.  The SARS-CoV-2 RNA is generally detectable in upper respiratory specimens during the acute phase of infection. The lowest concentration of SARS-CoV-2 viral copies this assay can detect is 138 copies/mL. A negative result does not preclude SARS-Cov-2 infection and should not be  used as the sole basis for treatment or other patient management decisions. A negative result may occur with  improper specimen collection/handling, submission of specimen other than nasopharyngeal  swab, presence of viral mutation(s) within the areas targeted by this assay, and inadequate number of viral copies(<138 copies/mL). A negative result must be combined with clinical observations, patient history, and epidemiological information. The expected result is Negative.  Fact Sheet for Patients:  BloggerCourse.com  Fact Sheet for Healthcare Providers:  SeriousBroker.it  This test is no t yet approved or cleared by the [place] FDA and  has been authorized for detection and/or diagnosis of SARS-CoV-2 by FDA under an Emergency Use Authorization (EUA). This EUA will remain  in effect (meaning this test can be used) for the duration of the COVID-19 declaration under Section 564(b)(1) of the Act, 21 U.S.C.section 360bbb-3(b)(1), unless the authorization is terminated  or revoked sooner.       Influenza A by PCR NEGATIVE NEGATIVE Final   Influenza B by PCR NEGATIVE NEGATIVE Final    Comment: (NOTE) The Xpert Xpress SARS-CoV-2/FLU/RSV plus assay is intended as an aid in the diagnosis of influenza from Nasopharyngeal swab specimens and should not be used as a sole basis for treatment. Nasal washings and aspirates are unacceptable for Xpert Xpress SARS-CoV-2/FLU/RSV testing.  Fact Sheet for Patients: BloggerCourse.com  Fact Sheet for Healthcare Providers: SeriousBroker.it  This test is not yet approved or cleared by the [place] FDA and has been authorized for detection and/or diagnosis of SARS-CoV-2 by FDA under an Emergency Use Authorization (EUA). This EUA will remain in effect (meaning this test can be used) for the duration of the COVID-19 declaration under Section 564(b)(1) of the Act, 21 U.S.C. section 360bbb-3(b)(1), unless the authorization is terminated or revoked.  Performed at [facility], [address], [place], [place] [address]       Labs: Basic Metabolic Panel: Recent Labs  Lab 08/09/20 1605 08/10/20 0415  NA 137 137  K 3.6 3.2*  CL 105 106  CO2 21* 23  GLUCOSE 87 93  BUN 8 8  CREATININE 0.62 0.66  CALCIUM 9.1 8.7*   Liver Function Tests: Recent Labs  Lab 08/10/20 0415  AST 15  ALT 13  ALKPHOS 48  BILITOT 0.9  PROT 6.7  ALBUMIN 3.6   No results for input(s): LIPASE, AMYLASE in the last 168 hours. No results for input(s): AMMONIA in the last 168 hours. CBC: Recent Labs  Lab 08/09/20 1605 08/10/20 0415  WBC 5.5 6.2  NEUTROABS 2.6  --   HGB 14.0 12.1  HCT 42.2 35.7*  MCV 87.7 87.1  PLT 309 277   Cardiac Enzymes: No results for input(s): CKTOTAL, CKMB, CKMBINDEX, TROPONINI in the last 168 hours. BNP: BNP (last 3 results) Recent Labs    08/09/20 1605  BNP 72.1    ProBNP (last 3 results) No results for input(s): PROBNP in the last 8760 hours.  CBG: No results for input(s): GLUCAP in the last 168 hours.     Signed:  Rhetta Mura MD   [facility]

## 2020-08-10 NOTE — Progress Notes (Signed)
While ambulating in the hall and bathroom, pt has moderate 7-8/10 discomfort to left leg, increase swelling noted ---- left leg. Will cont to monitor. SRP, RN

## 2020-08-10 NOTE — TOC Benefit Eligibility Note (Signed)
Transition of Care [facility]) Benefit Eligibility Note    Patient Details  Name: Susan Austin MRN: [id number] Date of Birth: 06-07-1986   Medication/Dose: Enoxaparion 40 MG/0.4ML injection  Covered?: Yes  Tier: 3 Drug  Prescription Coverage Preferred Pharmacy: [employer] with Person/Company/Phone Number:: Lisa/ [facility] [address] [phone]  Co-Pay: $3.00  Prior Approval: No  Deductible: Met       Kerin Salen Phone Number: 08/10/2020, 10:07 AM

## 2020-08-10 NOTE — Progress Notes (Signed)
Pt discharged to home, pt and friend able to demonstrate administration of Lovenox injections. Prescriptions given to pt. Pt acknowledged understanding. SRP, RN

## 2020-08-10 NOTE — TOC Transition Note (Signed)
Transition of Care [facility]) - CM/SW Discharge Note   Patient Details  Name: Susan Austin MRN: [id number] Date of Birth: 1986-04-23  Transition of Care [facility]) CM/SW Contact:  Darleene Cleaver, LCSW Phone Number: 08/10/2020, 2:38 PM   Clinical Narrative:     CSW received consult that patient may have medication needs.  Patient has Medicaid, and a benefit check was completed.  Patient's Lovenox would only be $3.00.  CSW can not provide any other assistance.   Final next level of care: Home w Home Health Services Barriers to Discharge: Barriers Resolved   Patient Goals and CMS Choice Patient states their goals for this hospitalization and ongoing recovery are:: To return back home.      Discharge Placement  Patient discharging back home, CSW signing off.                     Discharge Plan and Services                                     Social Determinants of Health (SDOH) Interventions     Readmission Risk Interventions No flowsheet data found.

## 2020-09-13 ENCOUNTER — Encounter: Payer: Medicaid Other | Admitting: Vascular Surgery

## 2020-09-21 ENCOUNTER — Other Ambulatory Visit: Payer: Self-pay

## 2020-09-21 ENCOUNTER — Ambulatory Visit (INDEPENDENT_AMBULATORY_CARE_PROVIDER_SITE_OTHER): Payer: Medicaid Other | Admitting: Vascular Surgery

## 2020-09-21 ENCOUNTER — Encounter: Payer: Self-pay | Admitting: Vascular Surgery

## 2020-09-21 VITALS — BP 134/78 | HR 90 | Temp 98.1°F | Resp 20 | Ht 63.0 in | Wt 226.0 lb

## 2020-09-21 DIAGNOSIS — O2230 Deep phlebothrombosis in pregnancy, unspecified trimester: Secondary | ICD-10-CM

## 2020-09-21 NOTE — Progress Notes (Signed)
[facility] [place]  ASSESSMENT / PLAN: 35 y.o. 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  History of DVT/PE.  She is currently taking Lovenox because she was pregnant at the time of diagnosis.  Counseled her that she can transition to a DOAC if she likes, but she just refilled her Lovenox and would like to continue this for now.  We will refer her to hematology/oncology for hypercoagulability work-up given her strong personal and family history of VTE.  No role for intervention on calf vein DVT.  She can follow-up with me as needed.  CHIEF COMPLAINT: DVT  HISTORY OF PRESENT ILLNESS: Susan Austin is a 35 y.o. female for evaluation of DVT/PE.  She has a history of the same.  Her last DVT was in 2012.  [facility] reporting left leg pain and difficulty breathing.  Left peroneal DVT was diagnosed on duplex.  She was found to have pulmonary embolism on CT.  She was found to be incidentally pregnant at the time.  Since then her leg pain has resolved.  Fortunately her difficulty breathing is continued.  She is no longer pregnant.  She continues to take Lovenox.  Past Medical History:  Diagnosis Date  . DVT (deep vein thrombosis) in pregnancy   . DVT (deep venous thrombosis) (HCC)   . Pulmonary emboli [facility](HCC)     Past Surgical History:  Procedure Laterality Date  . CESAREAN SECTION      Family History  Problem Relation Age of Onset  . Alcohol abuse Neg Hx   . Arthritis Neg Hx   . Asthma Neg Hx   . Birth defects Neg Hx   . Cancer Neg Hx   . COPD Neg Hx   . Depression Neg Hx   . Diabetes Neg Hx   . Drug abuse Neg Hx   . Early death Neg Hx   . Hearing loss Neg Hx   . Heart disease Neg Hx   . Hyperlipidemia Neg Hx   . Hypertension Neg Hx   . Kidney disease Neg Hx   . Learning disabilities Neg Hx   . Mental illness Neg Hx   . Mental retardation Neg Hx   . Miscarriages / Stillbirths Neg Hx   .  Stroke Neg Hx   . Vision loss Neg Hx     Social History   Socioeconomic History  . Marital status: Single    Spouse name: Not on file  . Number of children: Not on file  . Years of education: Not on file  . Highest education level: Not on file  Occupational History  . Not on file  Tobacco Use  . Smoking status: Never Smoker  . Smokeless tobacco: Never Used  Vaping Use  . Vaping Use: Never used  Substance and Sexual Activity  . Alcohol use: No  . Drug use: No  . Sexual activity: Yes    Birth control/protection: None  Other Topics Concern  . Not on file  Social History Narrative  . Not on file   Social Determinants of Health   Financial Resource Strain: Not on file  Food Insecurity: Not on file  Transportation Needs: Not on file  Physical Activity: Not on file  Stress: Not on file  Social Connections: Not on file  Intimate Partner Violence: Not on file    No Known Allergies  Current Outpatient Medications  Medication Sig Dispense Refill  . enoxaparin (LOVENOX)  80 MG/0.8ML injection Inject into the skin.    Marland Kitchen metroNIDAZOLE (METROGEL) 0.75 % vaginal gel Place vaginally at bedtime.     No current facility-administered medications for this visit.    REVIEW OF SYSTEMS:  [X]  denotes positive finding, [ ]  denotes negative finding Cardiac  Comments:  Chest pain or chest pressure: x   Shortness of breath upon exertion: x   Short of breath when lying flat: x   Irregular heart rhythm:        Vascular    Pain in calf, thigh, or hip brought on by ambulation: x   Pain in feet at night that wakes you up from your sleep:     Blood clot in your veins: x   Leg swelling:  x       Pulmonary    Oxygen at home:    Productive cough:     Wheezing:         Neurologic    Sudden weakness in arms or legs:     Sudden numbness in arms or legs:  x   Sudden onset of difficulty speaking or slurred speech:    Temporary loss of vision in one eye:     Problems with dizziness:          Gastrointestinal    Blood in stool:     Vomited blood:         Genitourinary    Burning when urinating:     Blood in urine:        Psychiatric    Major depression:         Hematologic    Bleeding problems:    Problems with blood clotting too easily: x       Skin    Rashes or ulcers:        Constitutional    Fever or chills:      PHYSICAL EXAM  Vitals:   09/21/20 1301  BP: 134/78  Pulse: 90  Resp: 20  Temp: 98.1 F (36.7 C)  SpO2: 98%  Weight: 226 lb (102.5 kg)  Height: 5\' 3"  (1.6 m)    Constitutional: well appearing. no distress. Appears well nourished.  Neurologic: CN intact. no focal findings. no sensory loss. Psychiatric: Mood and affect symmetric and appropriate. Eyes: No icterus. No conjunctival pallor. Ears, nose, throat: mucous membranes moist. Midline trachea.  Cardiac: regular rate and rhythm.  Respiratory: unlabored. Abdominal: soft, non-tender, non-distended.  Extremity: No edema. No cyanosis. No pallor.  Skin: No gangrene. No ulceration.  Lymphatic: No Stemmer's sign. No palpable lymphadenopathy.  PERTINENT LABORATORY AND RADIOLOGIC DATA  Most recent CBC CBC Latest Ref Rng & Units 08/10/2020 08/09/2020 07/17/2019  WBC 4.0 - 10.5 K/uL 6.2 5.5 4.7  Hemoglobin 12.0 - 15.0 g/dL 10/10/2020 08/11/2020 10.2(L)  Hematocrit 36.0 - 46.0 % 35.7(L) 42.2 33.4(L)  Platelets 150 - 400 K/uL 277 309 517(H)     Most recent CMP CMP Latest Ref Rng & Units 08/10/2020 08/09/2020 07/17/2019  Glucose 70 - 99 mg/dL 93 87 94  BUN 6 - 20 mg/dL 8 8 7   Creatinine 0.44 - 1.00 mg/dL 10/10/2020 08/11/2020 09/14/2019  Sodium 135 - 145 mmol/L 137 137 138  Potassium 3.5 - 5.1 mmol/L 3.2(L) 3.6 3.8  Chloride 98 - 111 mmol/L 106 105 107  CO2 22 - 32 mmol/L 23 21(L) 23  Calcium 8.9 - 10.3 mg/dL ) 9.1 9.4  Total Protein 6.5 - 8.1 g/dL 6.7 - -  Total Bilirubin 0.3 - 1.2 mg/dL  0.9 - -  Alkaline Phos 38 - 126 U/L 48 - -  AST 15 - 41 U/L 15 - -  ALT 0 - 44 U/L 13 - -   Lower Venous DVT Study    Indications: Pain.    Limitations: Poor ultrasound/tissue interface and body habitus.  Comparison Study: No prior studies.   Performing Technologist: Chanda Busing RVT     Examination Guidelines:  A complete evaluation includes B-mode imaging, spectral Doppler, color  Doppler,  and power Doppler as needed of all accessible portions of each vessel.  Bilateral  testing is considered an integral part of a complete examination. Limited  examinations for reoccurring indications may be performed as noted. The  reflux  portion of the exam is performed with the patient in reverse  Trendelenburg.      +-----+---------------+---------+-----------+----------+--------------+  RIGHTCompressibilityPhasicitySpontaneityPropertiesThrombus Aging  +-----+---------------+---------+-----------+----------+--------------+  CFV Full      Yes   Yes                  +-----+---------------+---------+-----------+----------+--------------+         +---------+---------------+---------+-----------+----------+--------------+   LEFT   CompressibilityPhasicitySpontaneityPropertiesThrombus  Aging  +---------+---------------+---------+-----------+----------+--------------+   CFV   Full      Yes   Yes                    +---------+---------------+---------+-----------+----------+--------------+   SFJ   Full                                 +---------+---------------+---------+-----------+----------+--------------+   FV Prox Full                                 +---------+---------------+---------+-----------+----------+--------------+   FV Mid  Full                                 +---------+---------------+---------+-----------+----------+--------------+   FV DistalFull                                  +---------+---------------+---------+-----------+----------+--------------+   PFV   Full                                 +---------+---------------+---------+-----------+----------+--------------+   POP   Full      Yes   Yes                    +---------+---------------+---------+-----------+----------+--------------+   PTV   Full                                 +---------+---------------+---------+-----------+----------+--------------+   PERO   None                      Acute        +---------+---------------+---------+-----------+----------+--------------+               Summary:  RIGHT:  - No evidence of common femoral vein obstruction.    LEFT:  - Findings consistent with acute deep vein thrombosis involving the left  peroneal veins.  - No cystic structure found in the popliteal fossa.    *See table(s) above for measurements and observations.  Electronically signed by Lemar Livings MD on 08/09/2020 at 5:51:04 PM.   Rande Brunt. Lenell Antu, MD [facility] Phone Number: [phone] 09/21/2020 1:17 PM

## 2020-09-23 ENCOUNTER — Telehealth: Payer: Self-pay | Admitting: *Deleted

## 2020-09-23 NOTE — Telephone Encounter (Signed)
Per referral 09/23/20 unable to lvm - mailed calendar with welcome packet

## 2020-10-21 ENCOUNTER — Inpatient Hospital Stay: Payer: Medicaid Other

## 2020-10-21 ENCOUNTER — Other Ambulatory Visit: Payer: Medicaid Other

## 2020-11-01 ENCOUNTER — Inpatient Hospital Stay: Payer: Medicaid Other

## 2020-11-01 ENCOUNTER — Inpatient Hospital Stay: Payer: Medicaid Other | Attending: Hematology & Oncology | Admitting: Hematology & Oncology

## 2021-07-09 IMAGING — CT CT ANGIO CHEST
3 of 9 series · 18 of 36 positions shown · IV contrast (Omnipaque)
Comparison: May 26, 2017

CLINICAL DATA: Shortness of breath.

EXAM:
CT ANGIOGRAPHY CHEST WITH CONTRAST
TECHNIQUE: Multidetector CT imaging of the chest was performed using the
standard protocol during bolus administration of intravenous
contrast. Multiplanar CT image reconstructions and MIPs were
obtained to evaluate the vascular anatomy.
CONTRAST:  85mL OMNIPAQUE IOHEXOL 350 MG/ML SOLN

[Series 5: pe thins · axial · 0.66mm/px · z∈[-263,-32]mm · 14 of 267 slices shown]
[im 18/267  lung]
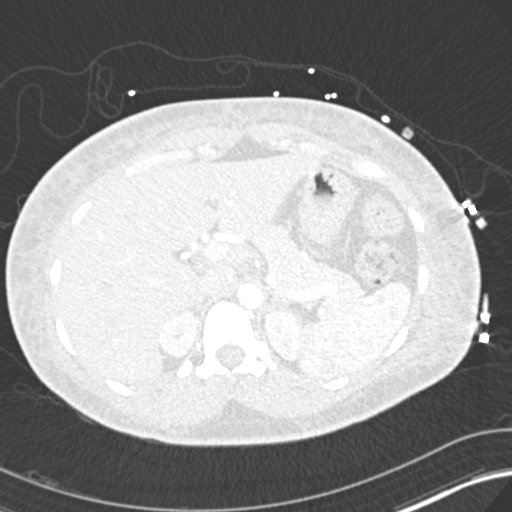
[im 36/267  mediastinal]
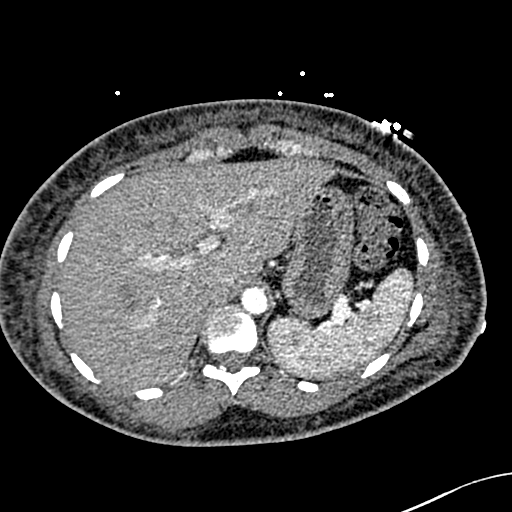
[im 54/267  lung]
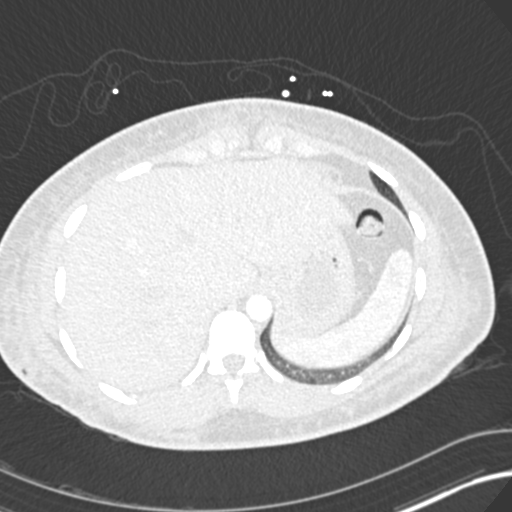
[im 71/267  mediastinal]
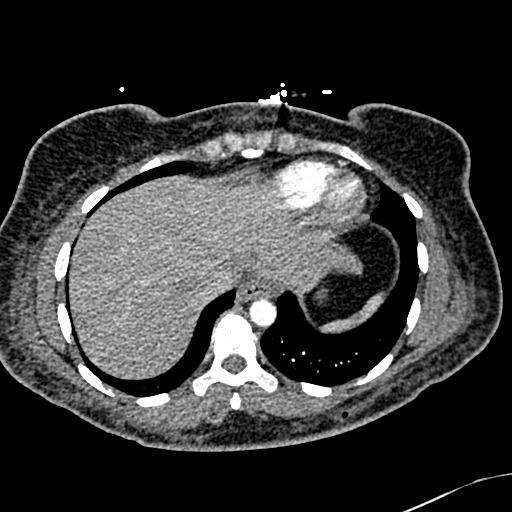
[im 89/267  lung]
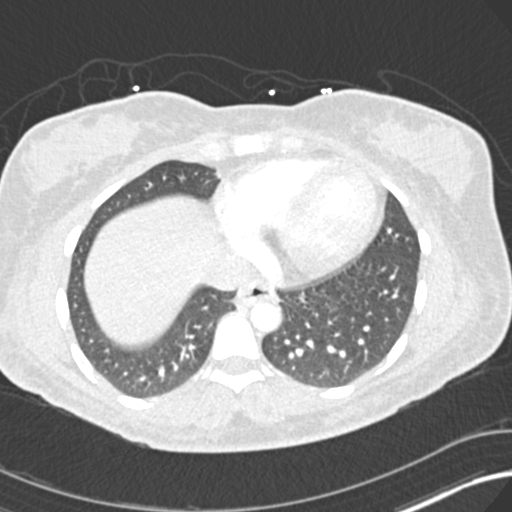
[im 107/267  mediastinal]
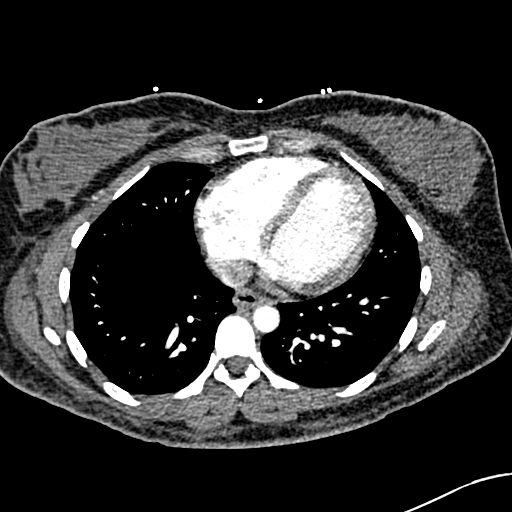
[im 125/267  lung]
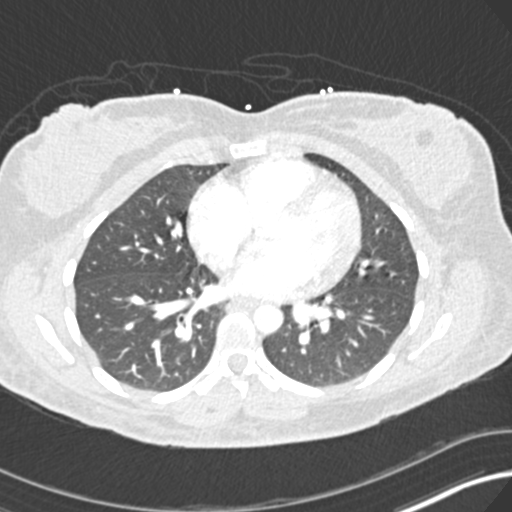
[im 142/267  mediastinal]
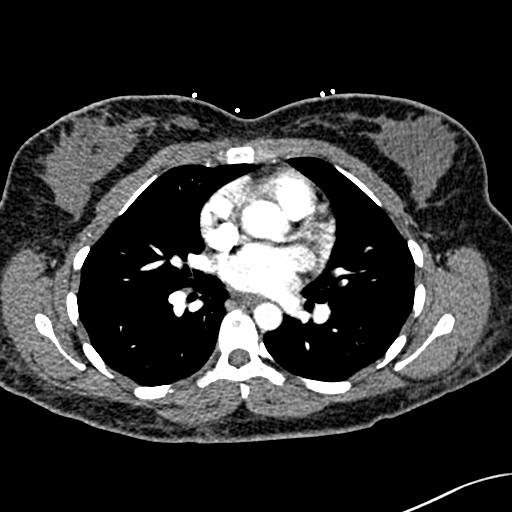
[im 160/267  lung]
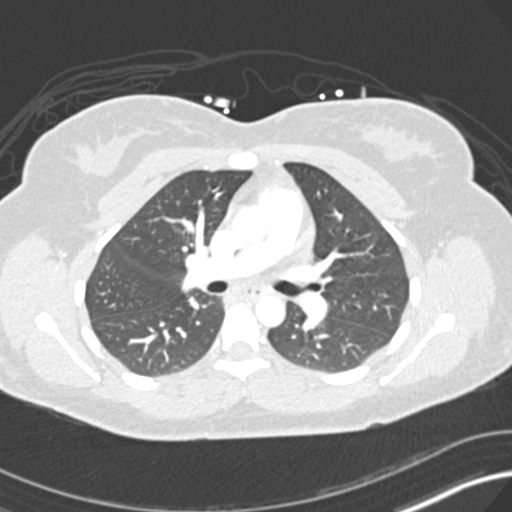
[im 178/267  mediastinal]
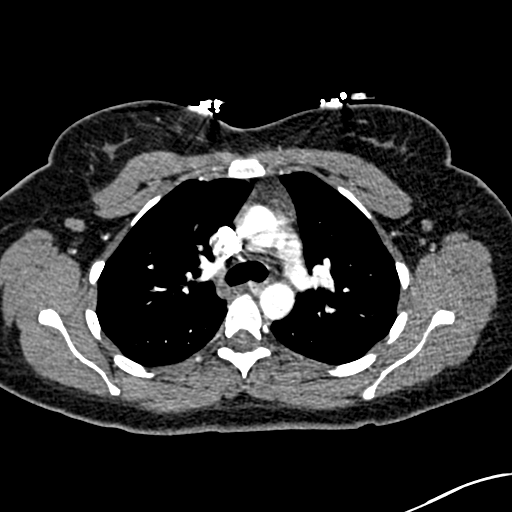
[im 196/267  lung]
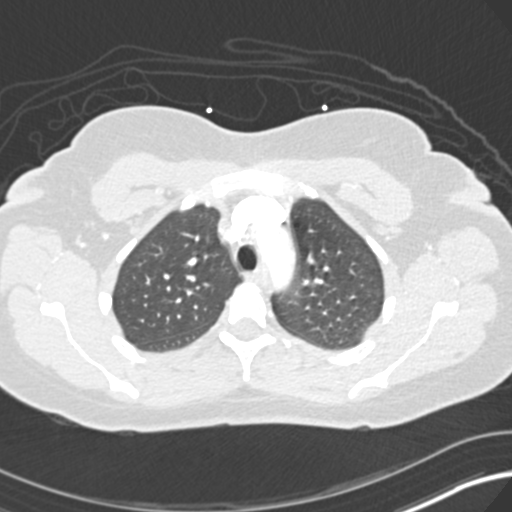
[im 213/267  mediastinal]
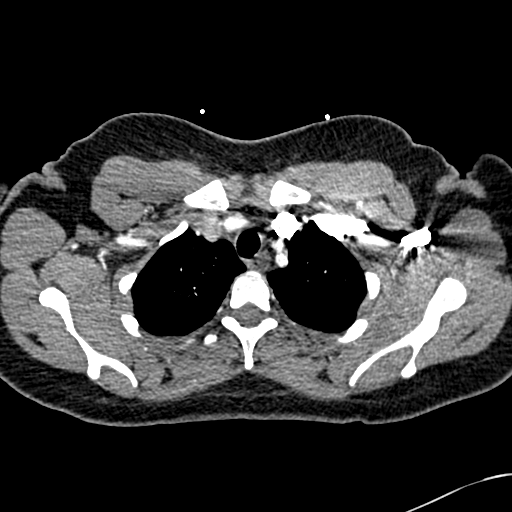
[im 231/267  lung]
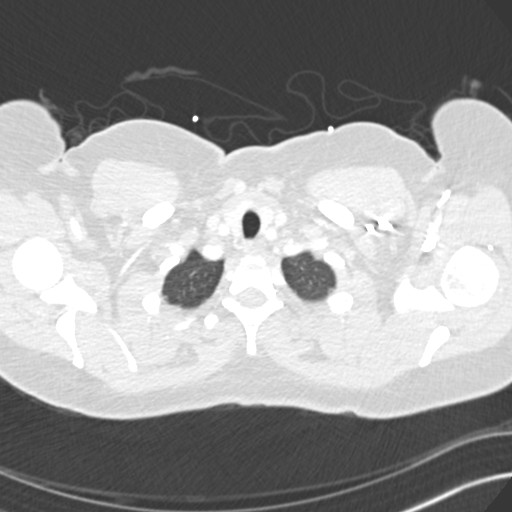
[im 249/267  mediastinal]
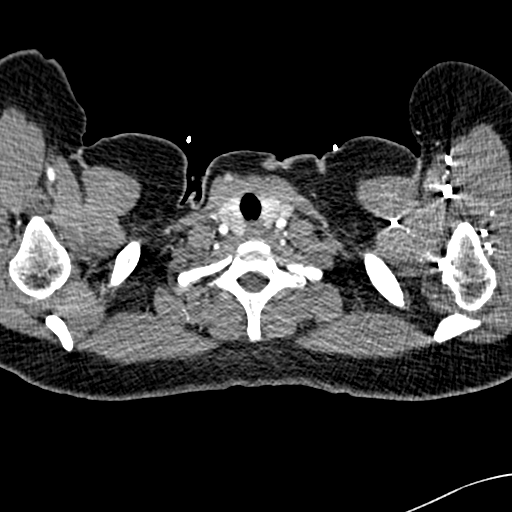

[Series 6: pe lung · axial · 0.86mm/px · z∈[-182,-71]mm · 3 of 75 slices shown]
[im 19/75  mediastinal]
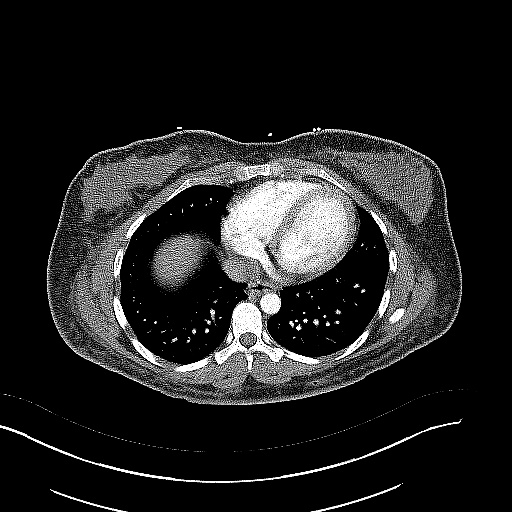
[im 38/75  mediastinal]
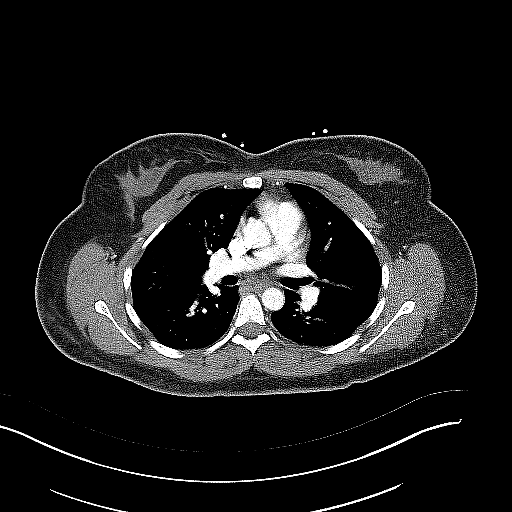
[im 56/75  mediastinal]
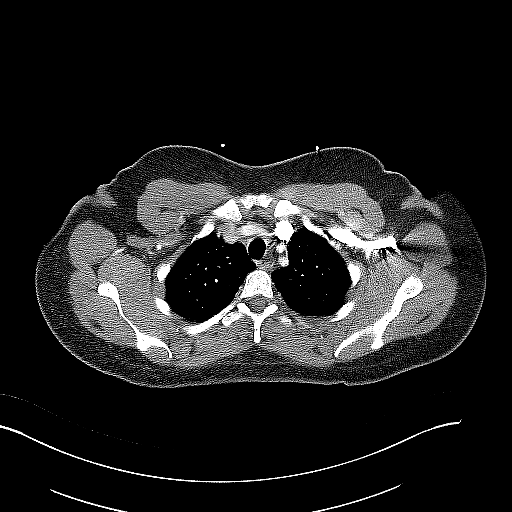

[Series 7: pe coronal mpr · coronal · 0.56mm/px · 1 of 120 slices shown]
[im 60/120  mediastinal]
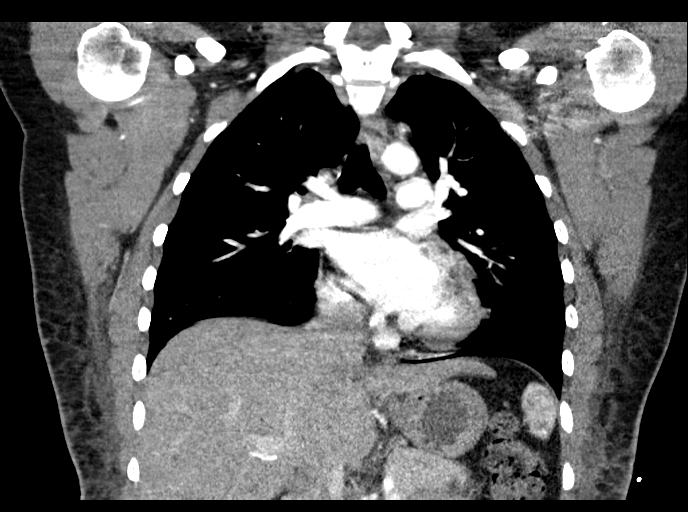

[18 of 36 positions shown; findings below may reference images not displayed]

FINDINGS: Cardiovascular: Satisfactory opacification of the pulmonary arteries
to the segmental level. No evidence of pulmonary embolism. Normal
heart size. No pericardial effusion.

Mediastinum/Nodes: No enlarged mediastinal, hilar, or axillary lymph
nodes. Thyroid gland, trachea, and esophagus demonstrate no
significant findings. Small hiatal hernia.

Lungs/Pleura: Lungs are clear. No pleural effusion or pneumothorax.

Upper Abdomen: No acute abnormality.

Musculoskeletal: Diffuse chest wall subcutaneous stranding with
multiple areas of subcutaneous emphysema, worse in the left upper
posterior chest wall. No acute or significant osseous findings.

Review of the MIP images confirms the above findings.
IMPRESSION: 1. No evidence of pulmonary embolus.
2. Diffuse chest wall subcutaneous stranding with multiple areas of
subcutaneous emphysema, worse in the left upper posterior chest
wall, presumably postprocedural. Please correlate clinically for
signs of cellulitis.

## 2022-07-03 ENCOUNTER — Emergency Department (HOSPITAL_COMMUNITY)
Admission: EM | Admit: 2022-07-03 | Discharge: 2022-07-03 | Disposition: A | Discharge status: Home / Self Care | Payer: Medicaid Other | Attending: Emergency Medicine | Admitting: Emergency Medicine

## 2022-07-03 ENCOUNTER — Encounter (HOSPITAL_COMMUNITY): Payer: Self-pay | Admitting: Emergency Medicine

## 2022-07-03 DIAGNOSIS — J039 Acute tonsillitis, unspecified: Secondary | ICD-10-CM

## 2022-07-03 DIAGNOSIS — J029 Acute pharyngitis, unspecified: Secondary | ICD-10-CM | POA: Diagnosis present

## 2022-07-03 LAB — GC/CHLAMYDIA PROBE AMP (~~LOC~~) NOT AT ARMC
Chlamydia: NEGATIVE
Comment: NEGATIVE
Comment: NORMAL
Neisseria Gonorrhea: NEGATIVE

## 2022-07-03 LAB — GROUP A STREP BY PCR: Group A Strep by PCR: NOT DETECTED

## 2022-07-03 MED ORDER — LIDOCAINE VISCOUS HCL 2 % MT SOLN
15.0000 mL | Freq: Once | OROMUCOSAL | Status: AC
  Administered 2022-07-03: 15 mL via OROMUCOSAL
  Filled 2022-07-03: qty 15

## 2022-07-03 MED ORDER — CLINDAMYCIN HCL 300 MG PO CAPS
300.0000 mg | ORAL_CAPSULE | Freq: Once | ORAL | Status: AC
  Administered 2022-07-03: 300 mg via ORAL
  Filled 2022-07-03: qty 1

## 2022-07-03 MED ORDER — CLINDAMYCIN HCL 150 MG PO CAPS: 300.0000 mg | ORAL_CAPSULE | Freq: Three times a day (TID) | ORAL | 0 refills | Status: DC

## 2022-07-03 MED ORDER — LIDOCAINE VISCOUS HCL 2 % MT SOLN: OROMUCOSAL | 0 refills | Status: DC

## 2022-07-03 MED ORDER — DEXAMETHASONE SODIUM PHOSPHATE 10 MG/ML IJ SOLN
10.0000 mg | Freq: Once | INTRAMUSCULAR | Status: AC
  Administered 2022-07-03: 10 mg via INTRAMUSCULAR
  Filled 2022-07-03: qty 1

## 2022-07-03 NOTE — ED Triage Notes (Signed)
Pt here from home with c/o sore throat over the last few days , pt throat is red and swollen

## 2022-07-03 NOTE — ED Provider Notes (Signed)
[facility] Emergency Department Provider Note MRN:  [id number]  Arrival date & time: 07/03/22     Chief Complaint   Sore Throat   History of Present Illness   Susan Austin is a 37 y.o. year-old female presents to the ED with chief complaint of sore throat.  States that onset was last week.  Has been taking amoxicillin without relief.  Denies fever or cough.  Reports pain is worsened with swallowing.    History provided by patient.   Review of Systems  Pertinent positive and negative review of systems noted in HPI.    Physical Exam   Vitals:   07/03/22 0042  BP: (!) 167/121  Pulse: 88  Resp: 18  Temp: 98.7 F (37.1 C)  SpO2: 99%    CONSTITUTIONAL:  non toxic-appearing, NAD NEURO:  Alert and oriented x 3, CN 3-12 grossly intact EYES:  eyes equal and reactive ENT/NECK:  Supple, no stridor, bilateral tonsillar exudates and erythema without abscess CARDIO:  normal rate, appears well-perfused  PULM:  No respiratory distress,  GI/GU:  non-distended,  MSK/SPINE:  No gross deformities, no edema, moves all extremities  SKIN:  no rash, atraumatic   *Additional and/or pertinent findings included in MDM below  Diagnostic and Interventional Summary    EKG Interpretation  Date/Time:    Ventricular Rate:    PR Interval:    QRS Duration:   QT Interval:    QTC Calculation:   R Axis:     Text Interpretation:         Labs Reviewed  GROUP A STREP BY PCR    No orders to display    Medications  lidocaine (XYLOCAINE) 2 % viscous mouth solution 15 mL (15 mLs Mouth/Throat Given 07/03/22 0253)  dexamethasone (DECADRON) injection 10 mg (10 mg Intramuscular Given 07/03/22 0254)  clindamycin (CLEOCIN) capsule 300 mg (300 mg Oral Given 07/03/22 0254)     Procedures  /  Critical Care Procedures  ED Course and Medical Decision Making  I have reviewed the triage vital signs, the nursing notes, and pertinent available records from the  EMR.  Social Determinants Affecting Complexity of Care: Patient has no clinically significant social determinants affecting this chief complaint..   ED Course:    Medical Decision Making Patient here with sore throat.  Symptoms look consistent with tonsillitis.  No abscess.  Has been on amox.  Might be viral, but given persistent symptoms, will switch to clinda.  Will Rx viscous lidocaine for gargle and spit for comfort.  Risk Prescription drug management.     Consultants: No consultations were needed in caring for this patient.   Treatment and Plan: Emergency department workup does not suggest an emergent condition requiring admission or immediate intervention beyond  what has been performed at this time. The patient is safe for discharge and has  been instructed to return immediately for worsening symptoms, change in  symptoms or any other concerns    Final Clinical Impressions(s) / ED Diagnoses     ICD-10-CM   1. Tonsillitis  J03.90       ED Discharge Orders          Ordered    clindamycin (CLEOCIN) 150 MG capsule  3 times daily        07/03/22 0301    lidocaine (XYLOCAINE) 2 % solution        07/03/22 0301              Discharge Instructions Discussed with  and Provided to Patient:   Discharge Instructions   None      Montine Circle, Hershal Coria 07/03/22 0303    Molpus, Jenny Reichmann, MD 07/03/22 [phone]

## 2022-12-06 ENCOUNTER — Encounter: Payer: Self-pay | Admitting: Primary Care

## 2022-12-06 ENCOUNTER — Encounter: Payer: Medicaid Other | Admitting: Primary Care

## 2022-12-06 NOTE — Patient Instructions (Signed)

## 2022-12-06 NOTE — Progress Notes (Signed)
 This encounter was created in error - please disregard.

## 2023-01-22 ENCOUNTER — Encounter (HOSPITAL_BASED_OUTPATIENT_CLINIC_OR_DEPARTMENT_OTHER): Payer: Self-pay

## 2023-01-22 DIAGNOSIS — R519 Headache, unspecified: Secondary | ICD-10-CM | POA: Diagnosis not present

## 2023-01-22 DIAGNOSIS — M546 Pain in thoracic spine: Secondary | ICD-10-CM | POA: Diagnosis present

## 2023-01-22 DIAGNOSIS — Y92410 Unspecified street and highway as the place of occurrence of the external cause: Secondary | ICD-10-CM | POA: Insufficient documentation

## 2023-01-22 LAB — PREGNANCY, URINE: Preg Test, Ur: NEGATIVE

## 2023-01-22 LAB — URINALYSIS, MICROSCOPIC (REFLEX)
RBC / HPF: >50 RBC/hpf (ref 0–5)
WBC, UA: >50 WBC/hpf (ref 0–5)

## 2023-01-22 LAB — URINALYSIS, ROUTINE W REFLEX MICROSCOPIC
Bilirubin Urine: NEGATIVE
Glucose, UA: NEGATIVE mg/dL
Ketones, ur: NEGATIVE mg/dL
Nitrite: POSITIVE — AB
Protein, ur: 100 mg/dL — AB
Specific Gravity, Urine: 1.02 (ref 1.005–1.030)
pH: 6 (ref 5.0–8.0)
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
## 2023-01-23 ENCOUNTER — Emergency Department (HOSPITAL_BASED_OUTPATIENT_CLINIC_OR_DEPARTMENT_OTHER): Payer: Medicaid Other

## 2023-01-23 ENCOUNTER — Emergency Department (HOSPITAL_BASED_OUTPATIENT_CLINIC_OR_DEPARTMENT_OTHER)
Admission: EM | Admit: 2023-01-23 | Discharge: 2023-01-23 | Disposition: A | Discharge status: Home / Self Care | Payer: Medicaid Other | Attending: Emergency Medicine | Admitting: Emergency Medicine

## 2023-01-23 DIAGNOSIS — M546 Pain in thoracic spine: Secondary | ICD-10-CM

## 2023-01-23 DIAGNOSIS — R519 Headache, unspecified: Secondary | ICD-10-CM

## 2023-01-23 MED ORDER — CEPHALEXIN 250 MG PO CAPS
500.0000 mg | ORAL_CAPSULE | Freq: Once | ORAL | Status: AC
  Administered 2023-01-23: 500 mg via ORAL
  Filled 2023-01-23: qty 2

## 2023-01-23 MED ORDER — NAPROXEN 250 MG PO TABS
500.0000 mg | ORAL_TABLET | Freq: Once | ORAL | Status: AC
  Administered 2023-01-23: 500 mg via ORAL
  Filled 2023-01-23: qty 2

## 2023-01-23 MED ORDER — CEPHALEXIN 500 MG PO CAPS: 500.0000 mg | ORAL_CAPSULE | Freq: Three times a day (TID) | ORAL | 0 refills | Status: AC
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
## 2023-01-23 NOTE — Discharge Instructions (Signed)
You were evaluated in the Emergency Department and after careful evaluation, we did not find any emergent condition requiring admission or further testing in the hospital.  Your exam/testing today is overall reassuring.  Symptoms may be due to a concussion.  Recommend Tylenol at home for discomfort.  Plenty of fluids and rest over the next few days.  Urine sample is showing signs of infection.  Take the Keflex antibiotic as directed.  Please return to the Emergency Department if you experience any worsening of your condition.   Thank you for allowing [place] to be a part of your care.

## 2023-11-20 ENCOUNTER — Other Ambulatory Visit: Payer: Self-pay

## 2023-11-20 ENCOUNTER — Inpatient Hospital Stay (HOSPITAL_COMMUNITY)

## 2023-11-20 ENCOUNTER — Encounter (HOSPITAL_COMMUNITY): Payer: Self-pay | Admitting: Obstetrics and Gynecology

## 2023-11-20 ENCOUNTER — Encounter: Payer: Self-pay | Admitting: Family Medicine

## 2023-11-20 ENCOUNTER — Inpatient Hospital Stay (HOSPITAL_COMMUNITY)
Admission: AD | Admit: 2023-11-20 | Discharge: 2023-11-20 | Discharge status: Against Medical Advice | Attending: Obstetrics and Gynecology | Admitting: Obstetrics and Gynecology

## 2023-11-20 DIAGNOSIS — O209 Hemorrhage in early pregnancy, unspecified: Secondary | ICD-10-CM | POA: Diagnosis present

## 2023-11-20 DIAGNOSIS — Z3A01 Less than 8 weeks gestation of pregnancy: Secondary | ICD-10-CM | POA: Diagnosis not present

## 2023-11-20 DIAGNOSIS — O3680X0 Pregnancy with inconclusive fetal viability, not applicable or unspecified: Secondary | ICD-10-CM

## 2023-11-20 LAB — WET PREP, GENITAL
Clue Cells Wet Prep HPF POC: NONE SEEN
Sperm: NONE SEEN
Trich, Wet Prep: NONE SEEN
WBC, Wet Prep HPF POC: <10 (ref <10)
Yeast Wet Prep HPF POC: NONE SEEN

## 2023-11-20 LAB — CBC
HCT: 37.8 % (ref 36.0–46.0)
Hemoglobin: 13.3 g/dL (ref 12.0–15.0)
MCH: 30.1 pg (ref 26.0–34.0)
MCHC: 35.2 g/dL (ref 30.0–36.0)
MCV: 85.5 fL (ref 80.0–100.0)
Platelets: 326 10*3/uL (ref 150–400)
RBC: 4.42 MIL/uL (ref 3.87–5.11)
RDW: 11.9 % (ref 11.5–15.5)
WBC: 8.6 10*3/uL (ref 4.0–10.5)
nRBC: 0 % (ref 0.0–0.2)

## 2023-11-20 LAB — URINALYSIS, ROUTINE W REFLEX MICROSCOPIC
Bilirubin Urine: NEGATIVE
Glucose, UA: NEGATIVE mg/dL
Ketones, ur: NEGATIVE mg/dL
Nitrite: NEGATIVE
Protein, ur: 30 mg/dL — AB
Specific Gravity, Urine: 1.017 (ref 1.005–1.030)
pH: 5 (ref 5.0–8.0)

## 2023-11-20 LAB — POCT PREGNANCY, URINE: Preg Test, Ur: POSITIVE — AB

## 2023-11-20 LAB — HCG, QUANTITATIVE, PREGNANCY: hCG, Beta Chain, Quant, S: 11231 m[IU]/mL — ABNORMAL HIGH (ref <5)

## 2023-11-20 NOTE — Progress Notes (Addendum)
 Pt still not in lobby.  Left mid evaluation.

## 2023-11-20 NOTE — MAU Note (Signed)
 Susan Austin is a 38 y.o. at Unknown here in MAU reporting: she having moderate VB that began yesterday.  Reports she's constant sharp pain in lower abdomen.  Denies recent intercourse.Denies taking meds to treat.   LMP: 10/01/2023 Onset of complaint: yesterday Pain score: 9 Vitals:   11/20/23 1616  BP: 116/65  Pulse: 80  Resp: 18  Temp: 98.4 F (36.9 C)  SpO2: 100%     FHT: NA  Lab orders placed from triage: UPT

## 2023-11-20 NOTE — Progress Notes (Signed)
 Pt called from lobby to be taken to radiology for ultrasound, no answer & pt not seen sitting in lobby.

## 2023-11-20 NOTE — MAU Provider Note (Addendum)
 History     CSN: [id number]  Arrival date and time: 11/20/23 1518   None     Chief Complaint  Patient presents with   Vaginal Bleeding   Vaginal Bleeding Pertinent negatives include no chest pain, chills, fever, nausea or vomiting.    Susan Austin is a 38 y.o. [phone] at [redacted]w[redacted]d who presents for evaluation of vaginal bleeding. Patient reports bleeding started yesterday and is similar to a period with passing small clots and cramping. She states she feels like she is having a miscarriage. She has not started prenatal care yet.  Patient rates the pain as a 9/10.   OB History     Gravida  7   Para  2   Term  2   Preterm      AB  2   Living  2      SAB  2   IAB      Ectopic      Multiple      Live Births              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    DVT (deep venous thrombosis) (HCC)    Pulmonary emboli (HCC)     Past Surgical History:  Procedure Laterality Date   CESAREAN SECTION      Family History  Problem Relation Age of Onset   Alcohol abuse Neg Hx    Arthritis Neg Hx    Asthma Neg Hx    Birth defects Neg Hx    Cancer Neg Hx    COPD Neg Hx    Depression Neg Hx    Diabetes Neg Hx    Drug abuse Neg Hx    Early death Neg Hx    Hearing loss Neg Hx    Heart disease Neg Hx    Hyperlipidemia Neg Hx    Hypertension Neg Hx    Kidney disease Neg Hx    Learning disabilities Neg Hx    Mental illness Neg Hx    Mental retardation Neg Hx    Miscarriages / Stillbirths Neg Hx    Stroke Neg Hx    Vision loss Neg Hx     Social History   Tobacco Use   Smoking status: Never   Smokeless tobacco: Never  Vaping Use   Vaping status: Never Used  Substance Use Topics   Alcohol use: No   Drug use: No    Allergies: No Known Allergies  No medications prior to admission.    Review of Systems  Constitutional:  Negative for chills and fever.  Respiratory:  Negative for shortness of  breath.   Cardiovascular:  Negative for chest pain.  Gastrointestinal:  Negative for nausea and vomiting.  Genitourinary:  Positive for pelvic pain and vaginal bleeding.   Physical Exam   Blood pressure 116/65, pulse 80, temperature 98.4 F (36.9 C), temperature source Oral, resp. rate 18, height 5\' 3"  (1.6 m), weight 91.7 kg, last menstrual period 10/01/2023, SpO2 100%, unknown if currently breastfeeding.  Patient Vitals for the past 24 hrs:  BP Temp Temp src Pulse Resp SpO2 Height Weight  11/20/23 1616 116/65 98.4 F (36.9 C) Oral 80 18 100 % -- --  11/20/23 1611 -- -- -- -- -- -- 5\' 3"  (1.6 m) 91.7 kg    Physical Exam Constitutional:      Appearance: Normal appearance.  Cardiovascular:     Rate and Rhythm: Normal rate.  Pulmonary:     Effort: Pulmonary effort is normal.  Neurological:     Mental Status: She is alert and oriented to person, place, and time.  Psychiatric:        Mood and Affect: Mood normal.        Behavior: Behavior normal.     MAU Course  Procedures  Results for orders placed or performed during the hospital encounter of 11/20/23 (from the past 24 hours)  Urinalysis, Routine w reflex microscopic -Urine, Clean Catch     Status: Abnormal   Collection Time: 11/20/23  4:20 PM  Result Value Ref Range   Color, Urine YELLOW YELLOW   APPearance HAZY (A) CLEAR   Specific Gravity, Urine 1.017 1.005 - 1.030   pH 5.0 5.0 - 8.0   Glucose, UA NEGATIVE NEGATIVE mg/dL   Hgb urine dipstick LARGE (A) NEGATIVE   Bilirubin Urine NEGATIVE NEGATIVE   Ketones, ur NEGATIVE NEGATIVE mg/dL   Protein, ur 30 (A) NEGATIVE mg/dL   Nitrite NEGATIVE NEGATIVE   Leukocytes,Ua TRACE (A) NEGATIVE   RBC / HPF 0-5 0 - 5 RBC/hpf   WBC, UA 11-20 0 - 5 WBC/hpf   Bacteria, UA FEW (A) NONE SEEN   Squamous Epithelial / HPF 6-10 0 - 5 /HPF   Mucus PRESENT   Pregnancy, urine POC     Status: Abnormal   Collection Time: 11/20/23  4:22 PM  Result Value Ref Range   Preg Test, Ur POSITIVE  (A) NEGATIVE  Wet prep, genital     Status: None   Collection Time: 11/20/23  5:41 PM   Specimen: Vaginal  Result Value Ref Range   Yeast Wet Prep HPF POC NONE SEEN NONE SEEN   Trich, Wet Prep NONE SEEN NONE SEEN   Clue Cells Wet Prep HPF POC NONE SEEN NONE SEEN   WBC, Wet Prep HPF POC <10 <10   Sperm NONE SEEN   CBC     Status: None   Collection Time: 11/20/23  5:47 PM  Result Value Ref Range   WBC 8.6 4.0 - 10.5 K/uL   RBC 4.42 3.87 - 5.11 MIL/uL   Hemoglobin 13.3 12.0 - 15.0 g/dL   HCT 60.4 54.0 - 98.1 %   MCV 85.5 80.0 - 100.0 fL   MCH 30.1 26.0 - 34.0 pg   MCHC 35.2 30.0 - 36.0 g/dL   RDW 19.1 47.8 - 29.5 %   Platelets 326 150 - 400 K/uL   nRBC 0.0 0.0 - 0.2 %  ABO/Rh     Status: None   Collection Time: 11/20/23  5:47 PM  Result Value Ref Range   ABO/RH(D)      B POS Performed at [facility], [address] [place], [place] [address]   hCG, quantitative, pregnancy     Status: Abnormal   Collection Time: 11/20/23  5:47 PM  Result Value Ref Range   hCG, Beta Chain, Quant, S 11,231 (H) <5 mIU/mL     No results found.    MDM ABO    B+ Wet prep, gc/ch BHCG Ob ultrasound  Assessment and Plan  Bleeding in early pregnancy  Patient left AMA prior to ultrasound being obtained  Doria Garden, SWHNP 11/20/2023, 11:07 PM   Attestation of Supervision of Student:  I confirm that I have verified the information documented in the nurse practitioner student's note. When I went to see the patient hey had left AMA. She left after speaking to the NP. Results were  reviewed after the patient left and I messaged her directly.   Abner Ables, MD [facility] for [facility], [facility] 11/20/2023 11:07 PM

## 2023-11-21 LAB — ABO/RH: ABO/RH(D): B POS

## 2023-11-21 LAB — GC/CHLAMYDIA PROBE AMP (~~LOC~~) NOT AT ARMC
Chlamydia: NEGATIVE
Comment: NEGATIVE
Comment: NORMAL
Neisseria Gonorrhea: NEGATIVE

## 2024-01-15 ENCOUNTER — Other Ambulatory Visit: Payer: Self-pay | Admitting: Physician Assistant

## 2024-01-15 DIAGNOSIS — E010 Iodine-deficiency related diffuse (endemic) goiter: Secondary | ICD-10-CM

## 2024-01-16 ENCOUNTER — Ambulatory Visit
Admission: RE | Admit: 2024-01-16 | Discharge: 2024-01-16 | Disposition: A | Discharge status: Home / Self Care | Source: Ambulatory Visit | Attending: Physician Assistant | Admitting: Physician Assistant

## 2024-01-16 DIAGNOSIS — E010 Iodine-deficiency related diffuse (endemic) goiter: Secondary | ICD-10-CM

## 2024-05-29 NOTE — Progress Notes (Signed)
 CC: Lower abdominal pain   Subjective:    HPI: Susan Austin is a 38 y.o. [phone] who presents complaining of lower abdominal pain in the setting of early pregnancy.  She reports LMP of 04/28/2024 and has had a positive pregnancy test at PCPs office.  She notes pain low across abdomen with sharper pains on the right side.  Past obstetric, gynecologic, medical, surgical, family, and social history reviewed and updated.    Allergies[1]  Current Medications[2]   Review Of System:  General: no fever, fatigue, weight or appetite changes Skin: no rashes, lesions, itching, mole change HEENT: no frequent headaches, hearing changes, sore throat, dental problems, sinus problems   Lymph: no tender or swollen lymph nodes  Breasts: no lumps, nipple discharge, breast pain Resp: no SOB, cough, wheezing Cardio: no chest pain, palpitations, edema GI: no N/V/D, constipation, anorexia, bloating, reflux, blood in stool, leakage of stool  GU: no frequency, dysuria, flank pain, hematuria, incontinence, retention       GYN: no change in discharge or odor, itching, inter-menstrual or post-coital bleeding, dyspareunia  Musk: no weakness, arthralgia, joint swelling, ROM limitations Endocrine: no polydypsia, polyphagia, polyuria, heat/cold intolerance, hot flashes, dry skin Psych: no depression, anxiety, panic attacks  __________________________________________________________________  Objective:   PHYSICAL EXAM:  Vitals:  BP 128/83   Pulse 79   Wt 98 kg (216 lb)   LMP 04/28/2024 (Exact Date)  BP 128/83   Pulse 79   Wt 98 kg (216 lb)   LMP 04/28/2024 (Exact Date)   Breastfeeding No   BMI 38.26 kg/m   Constitutional: Well-developed, well-nourished female in no acute distress HEENT: normocephalic, atraumatic  Respiratory: nonlabored breathing  Cardiovascular: acyanotic Abdomen: soft, NT, ND Extremities:  No calf pain, no edema GU: See ultrasound report      Assessment/Plan:   This is a 38 y.o. [id number] who presents with   1. Right lower quadrant abdominal pain affecting pregnancy (CMD) (Primary) - 4 weeks 3 days by LMP of 04/28/2024. -Lower abdominal pain with pain greater on the right -Ultrasound today reviewed with patient, single intrauterine gestational sac noted, right ovary with corpus luteum cyst, no adnexal masses. -Discussed pain is likely secondary to corpus luteum on right and while we do see a suspected gestational sac within the uterus, we will follow hCG to further evaluate for potential of ectopic pregnancy.  Based on ultrasound today, low suspicion for ectopic pregnancy. -Blood type Rh+. -OB and bleeding precautions reviewed. - Human Chorionic Gonadotropin  (HCG), Quantitative; Future - Human Chorionic Gonadotropin  (HCG), Quantitative  RTC in 2 for f/u US    This document serves as a record of services personally performed by Wells Nat Evens, MD.  It was created on their behalf by Darice Jenkins Simons, CMA, a trained medical scribe, and Certified Medical Assistant (CMA). During the course of documenting the history, physical exam and medical decision making, I was functioning as a stage manager. The creation of this record is the providers dictation and/or activities during the visit.  Electronically signed by Darice Jenkins Simons, CMA 05/29/2024 11:46 AM     Wells Nat Evens, MD  I agree the documentation is accurate and complete.  Electronically signed by: Wells Nat Evens, MD 05/30/2024 1:33 AM        [1] No Known Allergies [2] No current outpatient medications on file.

## 2024-06-05 ENCOUNTER — Encounter (HOSPITAL_COMMUNITY): Payer: Self-pay | Admitting: Obstetrics & Gynecology

## 2024-06-05 ENCOUNTER — Inpatient Hospital Stay (HOSPITAL_COMMUNITY)
Admission: AD | Admit: 2024-06-05 | Discharge: 2024-06-05 | Disposition: A | Discharge status: Home / Self Care | Attending: Obstetrics & Gynecology | Admitting: Obstetrics & Gynecology

## 2024-06-05 ENCOUNTER — Inpatient Hospital Stay (HOSPITAL_COMMUNITY)

## 2024-06-05 DIAGNOSIS — Z3A08 8 weeks gestation of pregnancy: Secondary | ICD-10-CM

## 2024-06-05 DIAGNOSIS — O4691 Antepartum hemorrhage, unspecified, first trimester: Secondary | ICD-10-CM

## 2024-06-05 DIAGNOSIS — Z3A01 Less than 8 weeks gestation of pregnancy: Secondary | ICD-10-CM

## 2024-06-05 DIAGNOSIS — O209 Hemorrhage in early pregnancy, unspecified: Secondary | ICD-10-CM

## 2024-06-05 LAB — WET PREP, GENITAL
Clue Cells Wet Prep HPF POC: NONE SEEN
Sperm: NONE SEEN
Trich, Wet Prep: NONE SEEN
WBC, Wet Prep HPF POC: >=10 — AB
Yeast Wet Prep HPF POC: NONE SEEN

## 2024-06-05 LAB — URINALYSIS, ROUTINE W REFLEX MICROSCOPIC
Bilirubin Urine: NEGATIVE
Glucose, UA: NEGATIVE mg/dL
Ketones, ur: NEGATIVE mg/dL
Leukocytes,Ua: NEGATIVE
Nitrite: NEGATIVE
Protein, ur: NEGATIVE mg/dL
Specific Gravity, Urine: 1.014 (ref 1.005–1.030)
pH: 6 (ref 5.0–8.0)

## 2024-06-05 LAB — CBC
HCT: 36.7 % (ref 36.0–46.0)
Hemoglobin: 12.7 g/dL (ref 12.0–15.0)
MCH: 30 pg (ref 26.0–34.0)
MCHC: 34.6 g/dL (ref 30.0–36.0)
MCV: 86.6 fL (ref 80.0–100.0)
Platelets: 306 K/uL (ref 150–400)
RBC: 4.24 MIL/uL (ref 3.87–5.11)
RDW: 12.4 % (ref 11.5–15.5)
WBC: 6.8 K/uL (ref 4.0–10.5)
nRBC: 0 % (ref 0.0–0.2)

## 2024-06-05 LAB — COMPREHENSIVE METABOLIC PANEL WITH GFR
ALT: 16 U/L (ref 0–44)
AST: 21 U/L (ref 15–41)
Albumin: 4 g/dL (ref 3.5–5.0)
Alkaline Phosphatase: 56 U/L (ref 38–126)
Anion gap: 10 (ref 5–15)
BUN: 12 mg/dL (ref 6–20)
CO2: 24 mmol/L (ref 22–32)
Calcium: 9.6 mg/dL (ref 8.9–10.3)
Chloride: 104 mmol/L (ref 98–111)
Creatinine, Ser: 0.96 mg/dL (ref 0.44–1.00)
GFR, Estimated: >60 mL/min
Glucose, Bld: 96 mg/dL (ref 70–99)
Potassium: 4.1 mmol/L (ref 3.5–5.1)
Sodium: 138 mmol/L (ref 135–145)
Total Bilirubin: 0.4 mg/dL (ref 0.0–1.2)
Total Protein: 7.2 g/dL (ref 6.5–8.1)

## 2024-06-05 LAB — ABO/RH: ABO/RH(D): B POS

## 2024-06-05 LAB — HCG, QUANTITATIVE, PREGNANCY: hCG, Beta Chain, Quant, S: 963 m[IU]/mL — ABNORMAL HIGH

## 2024-06-05 MED ORDER — PREPLUS 27-1 MG PO TABS: 1.0000 | ORAL_TABLET | Freq: Every day | ORAL | 13 refills | Status: DC

## 2024-06-05 NOTE — MAU Note (Signed)
..  Susan Austin is a 38 y.o. at [redacted]w[redacted]d here in MAU reporting: vaginal bleeding that began at 1500, has a picture with light brown bleeding on pad.  Abdominal cramping and vaginal pain that began an hour after bleeding.  Took ibuprofen  around 1600 and it helped with pain.   Pain score: 7/10 Vitals:   06/05/24 2128  BP: (!) 130/90  Pulse: 64  Resp: 16  Temp: 98.6 F (37 C)  SpO2: 100%     FHT:n/a Lab orders placed from triage:  UA

## 2024-06-05 NOTE — Discharge Instructions (Signed)
 Safe Medications in Pregnancy  - Take medications as directed on the package. Medications are listed by Brand name, store brands are considered equivalent to brand name, just be sure that the medications are the same. Ex: Tylenol (acetaminophen) - If taking multiple medications, please check labels to avoid duplicating the same active ingredients - Do not exceed 4000 mg of Tylenol (acetaminophen) in 24 hours - Do not take medications that contain aspirin  or ibuprofen  (Motrin , Advil ) or naproxen (Aleve, Naprosyn)  Acne Benzoyl Peroxide Salicylic Acid  Allergies Benadryl  (diphenhydramine ) Claritin (loratadine) Flonase nasal spray (fluticasone) Saline nasal spray/drops  Backache/Headache Acetaminophen (Tylenol): 2 regular strength (325mg ) every 4 hours OR 2 extra strength (500mg ) every 6 hours  Colds/Coughs Breathe right strips Cepacol throat lozenges OR Chloraseptic throat spray cough drops, alcohol free Delsym (dextromethorphan, cough suppressant) Mucinex (guaifenesin, mucolytic/expectorant) Robitussin DM (dextromethorphan + guaifenesin) Saline nasal spray/drops Sudafed (pseudoephedrine, decongestant)  only after [redacted] weeks gestation and if you do not have high blood pressure Vicks Vaporub Zinc lozenges Zyrtec (cetirizine)  Constipation Immediate relief Ducolax suppositories (bisacodyl) Fleet enema (saline enema) glycerin suppositories milk of magnesia Senokot (overnight) Smooth move tea (overnight)  to keep you regular Colace, Dulcolax (docusate) Metamucil (psyllium fiber) Miralax (polyethylene glycol)   Diarrhea Kaopectate (bismuth subsalicylate) Imodium A-D (loperamide) *NO pepto Bismol Hemorrhoids Anusol   Anusol  HC (Rx only) Preparation H Tucks  Indigestion Tums Maalox Mylanta Pepcid  Insomnia Benadryl  (alcohol free) 25mg  every 6 hours as needed Tylenol PM Unisom, no Gelcaps  Leg  Cramps Tums MagGel  Nausea/Vomiting Bonine Dramamine Emetrol Ginger extract Sea bands Meclizine (Rx only)  Nausea medication to take during pregnancy Unisom (doxylamine succinate 25 mg tablets) Take one half tablet daily at bedtime. Vitamin B6 100mg  tablets. Take one tablet twice a day (up to 200 mg per day).  Skin Rashes: Aveeno products Benadryl  cream or 25mg  pill every 6 hours as needed Calamine Lotion 1% cortisone cream  Yeast infection: Gyne-lotrimin 7 Monistat 7

## 2024-06-06 ENCOUNTER — Inpatient Hospital Stay (HOSPITAL_COMMUNITY)
Admission: AD | Admit: 2024-06-06 | Discharge: 2024-06-06 | Disposition: A | Discharge status: Home / Self Care | Attending: Obstetrics and Gynecology | Admitting: Obstetrics and Gynecology

## 2024-06-06 ENCOUNTER — Inpatient Hospital Stay (HOSPITAL_COMMUNITY)

## 2024-06-06 ENCOUNTER — Other Ambulatory Visit: Payer: Self-pay

## 2024-06-06 DIAGNOSIS — O200 Threatened abortion: Secondary | ICD-10-CM | POA: Diagnosis not present

## 2024-06-06 DIAGNOSIS — O09521 Supervision of elderly multigravida, first trimester: Secondary | ICD-10-CM | POA: Diagnosis not present

## 2024-06-06 DIAGNOSIS — Z3A01 Less than 8 weeks gestation of pregnancy: Secondary | ICD-10-CM | POA: Diagnosis not present

## 2024-06-06 LAB — GC/CHLAMYDIA PROBE AMP (~~LOC~~) NOT AT ARMC
Chlamydia: NEGATIVE
Comment: NEGATIVE
Comment: NORMAL
Neisseria Gonorrhea: NEGATIVE

## 2024-06-06 LAB — HCG, QUANTITATIVE, PREGNANCY: hCG, Beta Chain, Quant, S: 964 m[IU]/mL — ABNORMAL HIGH

## 2024-06-06 NOTE — MAU Provider Note (Signed)
 Chief Complaint:  Abdominal Pain and Vaginal Bleeding  HPI   Event Date/Time   First Provider Initiated Contact with Patient 06/06/24 1203     Susan Austin is a 38 y.o. [id number] at [redacted]w[redacted]d who presents to maternity admissions reporting increased bleeding with heavier clots since her MAU visit last night. Confused about what's going on and needs reassurance.   Pregnancy Course: Seen in MAU last night, had embryo with FHR: 133, no SCH.  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    DVT (deep venous thrombosis) (HCC)    Pulmonary emboli (HCC)    OB History  Gravida Para Term Preterm AB Living  7 2 2  3 2   SAB IAB Ectopic Multiple Live Births  3    2    # Outcome Date GA Lbr Len/2nd Weight Sex Type Anes PTL Lv  7 SAB 12/2023          6 SAB 11/2023          5 Term 08/11/07    M Vag-Spont   LIV  4 Term 06/15/03    M CS-Unspec   LIV  3 Gravida           2 Gravida              Birth Comments: System Generated. Please review and update pregnancy details.  1 SAB            Past Surgical History:  Procedure Laterality Date   CESAREAN SECTION     Family History  Problem Relation Age of Onset   Alcohol abuse Neg Hx    Arthritis Neg Hx    Asthma Neg Hx    Birth defects Neg Hx    Cancer Neg Hx    COPD Neg Hx    Depression Neg Hx    Diabetes Neg Hx    Drug abuse Neg Hx    Early death Neg Hx    Hearing loss Neg Hx    Heart disease Neg Hx    Hyperlipidemia Neg Hx    Hypertension Neg Hx    Kidney disease Neg Hx    Learning disabilities Neg Hx    Mental illness Neg Hx    Mental retardation Neg Hx    Miscarriages / Stillbirths Neg Hx    Stroke Neg Hx    Vision loss Neg Hx    Social History[1] Allergies[2] Medications Prior to Admission  Medication Sig Dispense Refill Last Dose/Taking   Prenatal Vit-Fe Fumarate-FA (PREPLUS) 27-1 MG TABS Take 1 tablet by mouth daily. 30 tablet 13 Past Week   I have reviewed patient's Past Medical  Hx, Surgical Hx, Family Hx, Social Hx, medications and allergies.   ROS  Pertinent items noted in HPI and remainder of comprehensive ROS otherwise negative.   PHYSICAL EXAM  Patient Vitals for the past 24 hrs:  BP Temp Temp src Pulse Resp SpO2 Height Weight  06/06/24 0951 130/82 98.2 F (36.8 C) Oral 71 16 98 % 5' 3 (1.6 m) 220 lb 4.8 oz (99.9 kg)   Constitutional: Well-developed, well-nourished female in no acute distress.  Cardiovascular: normal rate & rhythm, warm and well-perfused Respiratory: normal effort, no problems with respiration noted GI: Abd soft, non-tender, non-distended MS: Extremities nontender, no edema, normal ROM Neurologic: Alert and oriented x 4.  GU: no CVA tenderness Pelvic: exam deferred, sent to U/S   Labs: Results for orders placed or performed during the  hospital encounter of 06/06/24 (from the past 24 hours)  hCG, quantitative, pregnancy     Status: Abnormal   Collection Time: 06/06/24 10:08 AM  Result Value Ref Range   hCG, Beta Chain, Quant, S 964 (H) <5 mIU/mL   Imaging:  US  OB Transvaginal Result Date: 06/06/2024 EXAM: OBSTETRIC ULTRASOUND FIRST TRIMESTER TECHNIQUE: Transvaginal first trimester obstetric pelvic duplex ultrasound was performed with real-time imaging, color flow Doppler imaging, and spectral analysis. COMPARISON: US  OB Less Than 14 weeks 06/05/2024. CLINICAL HISTORY: Vaginal bleeding affecting early pregnancy. FINDINGS: UTERUS: No focal myometrial mass. GESTATIONAL SAC(S): Single, live intrauterine gestation with a yolk sac and fetal pole present. No subchorionic hemorrhage. YOLK SAC: Present. EMBRYO(<11WK) /FETUS(>=11WK): Single, live fetal pole. CROWN RUMP LENGTH: 2.5 mm. RATE OF CARDIAC ACTIVITY: 90 bpm. RIGHT OVARY: Heterogeneous echogenicity, likely due to the developing corpus luteum. LEFT OVARY: Unremarkable. FREE FLUID: No free fluid. MEASUREMENTS ESTIMATED GESTATIONAL AGE BY CURRENT ULTRASOUND: 5 weeks and 6 days. ESTIMATED  GESTATIONAL AGE BY LMP/PRIOR ULTRASOUND: Not provided. ESTIMATED DUE DATE: 01/31/2025. IMPRESSION: 1. Single, live, intrauterine gestation with an estimated gestational age of [redacted] weeks 6 days. Fetal heart rate of 90 bpm. Electronically signed by: Rogelia Myers MD 06/06/2024 11:05 AM EST RP Workstation: [id number]   US  OB LESS THAN 14 WEEKS WITH OB TRANSVAGINAL Result Date: 06/05/2024 EXAM: ULTRASOUND FIRST TRIMESTER TECHNIQUE: Transabdominal and Transvaginal first trimester obstetric pelvic duplex ultrasound was performed with real-time imaging, color flow Doppler imaging, and spectral analysis. COMPARISON: None available. CLINICAL HISTORY: Pregnancy, location unknown. FINDINGS: UTERUS: No focal myometrial mass. GESTATIONAL SAC(S): Single intrauterine gestational sac. No subchorionic hemorrhage. YOLK SAC: Present. EMBRYO(<11WK) /FETUS(>=11WK): Single embryo. CROWN RUMP LENGTH: 2.4 mm. RATE OF CARDIAC ACTIVITY: 133 beats per minute. RIGHT OVARY: Unremarkable. Normal arterial and venous flow. LEFT OVARY: Unremarkable. Normal arterial and venous flow. FREE FLUID: No free fluid. MEASUREMENTS ESTIMATED GESTATIONAL AGE BY CURRENT ULTRASOUND: 5 weeks 5 days. ESTIMATED DUE DATE: 01/31/2025. IMPRESSION: 1. Single live intrauterine pregnancy with embryonic cardiac activity at 133 beats per minute. 2. Estimated gestational age by current ultrasound is 5 weeks 5 days, with [facility] 01/31/25. Electronically signed by: Norman Gatlin MD 06/05/2024 10:52 PM EST RP Workstation: [id number]   MDM & MAU COURSE  MDM: Moderate  MAU Course: Orders Placed This Encounter  Procedures   US  OB Transvaginal   hCG, quantitative, pregnancy   Discharge patient   No orders of the defined types were placed in this encounter.  Repeated HCG despite it only being 12hrs since last, as well as ultrasound. HCG the same, U/S still did not show SCH although FHR now 90. Explained normal development in pregnancy, absence of SCH and that decreased  FHR may be indicative of a threatened miscarriage. Advised to go home, rest, use a heating pad and see how the weekend progresses. She has follow up on 1/5, encouraged to keep this appointment to evaluate for either SAB or pregnancy viability. Pt appreciative of explanation and amenable to plan.  ASSESSMENT   1. Threatened miscarriage   2. [redacted] weeks gestation of pregnancy    PLAN  Discharge home in stable condition with return precautions.     Allergies as of 06/06/2024   No Known Allergies      Medication List     TAKE these medications    PrePLUS 27-1 MG Tabs Take 1 tablet by mouth daily.       Cornell Finder, CNM, MSN, IBCLC Certified Nurse Midwife, [facility]        [  1]  Social History Tobacco Use   Smoking status: Never   Smokeless tobacco: Never  Vaping Use   Vaping status: Never Used  Substance Use Topics   Alcohol use: No   Drug use: No  [2] No Known Allergies

## 2024-06-06 NOTE — MAU Note (Signed)
 Susan Austin is a 38 y.o. at [redacted]w[redacted]d here in MAU reporting: was seen in MAU last night for vaginal spotting and abd pain. Here today for increase in pain and bleeding. States the bleeding has picked up and has small clots present when she wipes. States the lower cramping has picked up. Has not taken any pain medication today.  Results from US  06/05/24: IMPRESSION: 1. Single live intrauterine pregnancy with embryonic cardiac activity at 133 beats per minute. 2. Estimated gestational age by current ultrasound is 5 weeks 5 days, with [facility] 01/31/25.   Onset of complaint: ongoing  Pain score: 9 Vitals:   06/06/24 0951  BP: 130/82  Pulse: 71  Resp: 16  Temp: 98.2 F (36.8 C)  SpO2: 98%      Lab orders placed from triage:

## 2024-06-06 NOTE — MAU Provider Note (Signed)
 " History     CSN: [id number]  Arrival date and time: 06/05/24 2100 First Provider Initiated Contact with Patient   Chief Complaint  Patient presents with   Vaginal Bleeding   Abdominal Pain    HPI Susan Austin is a 38 y.o. [id number] at [redacted]w[redacted]d, 02/02/2025, by Last Menstrual Period, who presents to the Maternity Assessment Unit for VB and cramps.  Patient reports VB w/ clots that started approx 1500 today. Now, the bleeding is light. She reports a similar episode of light spotting about a week ago.   ROS (+) VB, cramping, fatigue (-) dizzines, palpitations,    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    DVT (deep venous thrombosis) (HCC)    Pulmonary emboli (HCC)    Past Surgical History:  Procedure Laterality Date   CESAREAN SECTION     Allergies[1]  Physical Exam  BP (!) 130/90 (BP Location: Right Arm)   Pulse 64   Temp 98.6 F (37 C) (Oral)   Resp 16   Ht 5' 3 (1.6 m)   Wt 99.9 kg   LMP 04/28/2024   SpO2 100%   BMI 39.02 kg/m   Gen: alert, no acute distress CV: regular rate Resp: nonlabored Abd: tender w/o guarding in periumbilical region, no lower quadrant tenderness  Labs --/--/B POS (12/25 2205)   Results for orders placed or performed during the hospital encounter of 06/05/24 (from the past 24 hours)  CBC     Status: None   Collection Time: 06/05/24 10:05 PM  Result Value Ref Range   WBC 6.8 4.0 - 10.5 K/uL   RBC 4.24 3.87 - 5.11 MIL/uL   Hemoglobin 12.7 12.0 - 15.0 g/dL   HCT 63.2 63.9 - 53.9 %   MCV 86.6 80.0 - 100.0 fL   MCH 30.0 26.0 - 34.0 pg   MCHC 34.6 30.0 - 36.0 g/dL   RDW 87.5 88.4 - 84.4 %   Platelets 306 150 - 400 K/uL   nRBC 0.0 0.0 - 0.2 %  Comprehensive metabolic panel with GFR     Status: None   Collection Time: 06/05/24 10:05 PM  Result Value Ref Range   Sodium 138 135 - 145 mmol/L   Potassium 4.1 3.5 - 5.1 mmol/L   Chloride 104 98 - 111 mmol/L   CO2 24 22 - 32 mmol/L    Glucose, Bld 96 70 - 99 mg/dL   BUN 12 6 - 20 mg/dL   Creatinine, Ser 9.03 0.44 - 1.00 mg/dL   Calcium 9.6 8.9 - 89.6 mg/dL   Total Protein 7.2 6.5 - 8.1 g/dL   Albumin 4.0 3.5 - 5.0 g/dL   AST 21 15 - 41 U/L   ALT 16 0 - 44 U/L   Alkaline Phosphatase 56 38 - 126 U/L   Total Bilirubin 0.4 0.0 - 1.2 mg/dL   GFR, Estimated >39 >39 mL/min   Anion gap 10 5 - 15  hCG, quantitative, pregnancy     Status: Abnormal   Collection Time: 06/05/24 10:05 PM  Result Value Ref Range   hCG, Beta Chain, Quant, S 963 (H) <5 mIU/mL  ABO/Rh     Status: None   Collection Time: 06/05/24 10:05 PM  Result Value Ref Range   ABO/RH(D) B POS    No rh immune globuloin      NOT A RH IMMUNE GLOBULIN CANDIDATE, PT RH POSITIVE Performed at [facility], [address], [place], [place] [address]   Urinalysis, Routine w reflex microscopic -Urine, Clean Catch     Status: Abnormal   Collection Time: 06/05/24 10:20 PM  Result Value Ref Range   Color, Urine YELLOW YELLOW   APPearance HAZY (A) CLEAR   Specific Gravity, Urine 1.014 1.005 - 1.030   pH 6.0 5.0 - 8.0   Glucose, UA NEGATIVE NEGATIVE mg/dL   Hgb urine dipstick MODERATE (A) NEGATIVE   Bilirubin Urine NEGATIVE NEGATIVE   Ketones, ur NEGATIVE NEGATIVE mg/dL   Protein, ur NEGATIVE NEGATIVE mg/dL   Nitrite NEGATIVE NEGATIVE   Leukocytes,Ua NEGATIVE NEGATIVE   RBC / HPF 0-5 0 - 5 RBC/hpf   WBC, UA 0-5 0 - 5 WBC/hpf   Bacteria, UA RARE (A) NONE SEEN   Squamous Epithelial / HPF 0-5 0 - 5 /HPF   Mucus PRESENT   Wet prep, genital     Status: Abnormal   Collection Time: 06/05/24 10:40 PM  Result Value Ref Range   Yeast Wet Prep HPF POC NONE SEEN NONE SEEN   Trich, Wet Prep NONE SEEN NONE SEEN   Clue Cells Wet Prep HPF POC NONE SEEN NONE SEEN   WBC, Wet Prep HPF POC >=10 (A) <10   Sperm NONE SEEN     Imaging US  OB Comp Less 14 Wks with OB Transvaginal 1. Single live intrauterine pregnancy with embryonic cardiac activity at 133 beats per  minute. 2. Estimated gestational age by current ultrasound is 5 weeks 5 days, with [facility] 01/31/25.   Electronically signed by: Norman Gatlin MD   Assessment and Plan  MDM Susan Austin is a 38 y.o. [id number] at [redacted]w[redacted]d, 02/02/2025, by Last Menstrual Period, who presents to the MAU for VB and cramping. Ddx: 1st trimester vaginal bleeding includes but is not limited to: ectopic pregnancy, complete spontaneous abortion, incomplete abortion, missed abortion, threatened abortion, embryonic/fetal demise, cervical insufficiency, cervical or vaginal disorder .  Orders Placed This Encounter  Procedures   Wet prep, genital   US  OB LESS THAN 14 WEEKS WITH OB TRANSVAGINAL   Urinalysis, Routine w reflex microscopic -Urine, Clean Catch   CBC   Comprehensive metabolic panel with GFR   hCG, quantitative, pregnancy   ABO/Rh   Discharge patient   Meds ordered this encounter  Medications   Prenatal Vit-Fe Fumarate-FA (PREPLUS) 27-1 MG TABS    Sig: Take 1 tablet by mouth daily.    Dispense:  30 tablet    Refill:  13    Vaginal bleeding in pregnancy, first trimester Less than [redacted] weeks gestation of pregnancy CRL c/w LMP. No SCH. Possible cervicitis after intercourse, however would not expect clots in such case. Has IOB with [facility] scheduled. Rx PNV sent.  Results pending at the time of DC: GC/CT Dispo: DC home in stable condition with return precautions discussed and included in AVS.    Barabara Maier, DO FM-OB Fellow [facility] for [facility]     [1] No Known Allergies  "

## 2024-06-10 ENCOUNTER — Inpatient Hospital Stay (HOSPITAL_COMMUNITY)
Admission: AD | Admit: 2024-06-10 | Discharge: 2024-06-10 | Disposition: A | Discharge status: Home / Self Care | Payer: Self-pay | Attending: Obstetrics and Gynecology | Admitting: Obstetrics and Gynecology

## 2024-06-10 ENCOUNTER — Inpatient Hospital Stay (HOSPITAL_COMMUNITY)

## 2024-06-10 ENCOUNTER — Other Ambulatory Visit: Payer: Self-pay

## 2024-06-10 ENCOUNTER — Encounter (HOSPITAL_COMMUNITY): Payer: Self-pay | Admitting: Obstetrics and Gynecology

## 2024-06-10 DIAGNOSIS — N939 Abnormal uterine and vaginal bleeding, unspecified: Secondary | ICD-10-CM

## 2024-06-10 DIAGNOSIS — O039 Complete or unspecified spontaneous abortion without complication: Secondary | ICD-10-CM | POA: Diagnosis not present

## 2024-06-10 DIAGNOSIS — Z3A01 Less than 8 weeks gestation of pregnancy: Secondary | ICD-10-CM

## 2024-06-10 DIAGNOSIS — R109 Unspecified abdominal pain: Secondary | ICD-10-CM | POA: Diagnosis not present

## 2024-06-10 DIAGNOSIS — O26899 Other specified pregnancy related conditions, unspecified trimester: Secondary | ICD-10-CM

## 2024-06-10 DIAGNOSIS — O26891 Other specified pregnancy related conditions, first trimester: Secondary | ICD-10-CM

## 2024-06-10 DIAGNOSIS — O209 Hemorrhage in early pregnancy, unspecified: Secondary | ICD-10-CM | POA: Diagnosis present

## 2024-06-10 LAB — CBC
HCT: 37.7 % (ref 36.0–46.0)
Hemoglobin: 13.1 g/dL (ref 12.0–15.0)
MCH: 29.8 pg (ref 26.0–34.0)
MCHC: 34.7 g/dL (ref 30.0–36.0)
MCV: 85.7 fL (ref 80.0–100.0)
Platelets: 304 K/uL (ref 150–400)
RBC: 4.4 MIL/uL (ref 3.87–5.11)
RDW: 12.3 % (ref 11.5–15.5)
WBC: 3.5 K/uL — ABNORMAL LOW (ref 4.0–10.5)
nRBC: 0 % (ref 0.0–0.2)

## 2024-06-10 LAB — HCG, QUANTITATIVE, PREGNANCY: hCG, Beta Chain, Quant, S: 44 m[IU]/mL — ABNORMAL HIGH

## 2024-06-10 MED ORDER — KETOROLAC TROMETHAMINE 30 MG/ML IJ SOLN
30.0000 mg | Freq: Once | INTRAMUSCULAR | Status: AC
  Administered 2024-06-10: 30 mg via INTRAMUSCULAR
  Filled 2024-06-10: qty 1

## 2024-06-10 MED ORDER — KETOROLAC TROMETHAMINE 10 MG PO TABS: 10.0000 mg | ORAL_TABLET | Freq: Four times a day (QID) | ORAL | 0 refills | Status: AC | PRN

## 2024-06-10 NOTE — MAU Provider Note (Signed)
 " None     S [name] is a 38 y.o. [phone] patient who presents to MAU today with complaint of heavy vaginal bleeding.  Patient reports she think she is having a miscarriage she has been bleeding for 6 days straight with lower abdominal cramping.  Patient reports she is changing her pad every 2-3 hours and they are saturated.  She has been taking Tylenol  for her cramping which has not worked.  She is reporting her last dose of Tylenol  at 2100 yesterday and is requesting additional medication for pain now.     O BP (!) 141/88 (BP Location: Right Arm)   Pulse 63   Temp 98.3 F (36.8 C) (Oral)   Resp 20   Ht 5' 3 (1.6 m)   Wt 99.5 kg   LMP 04/28/2024   SpO2 97%   BMI 38.86 kg/m  Physical Exam Vitals and nursing note reviewed.  Constitutional:      General: She is not in acute distress.    Appearance: She is well-developed. She is obese. She is not ill-appearing.  HENT:     Head: Normocephalic.  Cardiovascular:     Rate and Rhythm: Normal rate.  Pulmonary:     Effort: Pulmonary effort is normal.  Abdominal:     Palpations: Abdomen is soft.  Musculoskeletal:        General: Normal range of motion.     Cervical back: Normal range of motion.  Skin:    General: Skin is warm.  Neurological:     Mental Status: She is alert and oriented to person, place, and time.  Psychiatric:        Behavior: Behavior normal.    Pelvic: Exam chaperoned by Bobbette Blumenthal RN Speculum exam revealed scant amount of old vaginal blood , dark in color, no pooling, cervical os visually closed but oozing noted from os. No Clots or evidence of POC in vaginal vault    MDM  HIGH  Vaginal bleeding in early pregnacy CBC HCG Quant: 44 Today  Lab Results  Component Value Date   HCGBETAQNT 44 (H) 06/10/2024   HCGBETAQNT 964 (H) 06/06/2024   HCGBETAQNT 963 (H) 06/05/2024    ABO: B Positive OB Ultrasound Vaginal Swabs: Previously obtained on 12/25 Negative wet prep and  GC/Chlamydia CX    Differential diagnosis considered for 1st trimester vaginal bleeding includes but is not limited to: ectopic pregnancy, complete spontaneous abortion, incomplete abortion, missed abortion, threatened abortion, embryonic/fetal demise, cervical insufficiency, cervical or vaginal disorder    Meds ordered this encounter  Medications   ketorolac  (TORADOL ) 30 MG/ML injection 30 mg     Orders Placed This Encounter  Procedures   US  OB Transvaginal    Standing Status:   Standing    Number of Occurrences:   1    Symptom/Reason for Exam:   Vaginal bleeding affecting early pregnancy [[id number]]    Release to patient:   Immediate    Call Results- Best Contact Number?:   Threatned miscarriage   CBC    Standing Status:   Standing    Number of Occurrences:   1   hCG, quantitative, pregnancy    Standing Status:   Standing    Number of Occurrences:   1      Results for orders placed or performed during the hospital encounter of 06/10/24 (from the past 24 hours)  CBC     Status: Abnormal   Collection Time: 06/10/24 11:29 AM  Result Value Ref  Range   WBC 3.5 (L) 4.0 - 10.5 K/uL   RBC 4.40 3.87 - 5.11 MIL/uL   Hemoglobin 13.1 12.0 - 15.0 g/dL   HCT 62.2 63.9 - 53.9 %   MCV 85.7 80.0 - 100.0 fL   MCH 29.8 26.0 - 34.0 pg   MCHC 34.7 30.0 - 36.0 g/dL   RDW 87.6 88.4 - 84.4 %   Platelets 304 150 - 400 K/uL   nRBC 0.0 0.0 - 0.2 %  hCG, quantitative, pregnancy     Status: Abnormal   Collection Time: 06/10/24 11:29 AM  Result Value Ref Range   hCG, Beta Chain, Quant, S 44 (H) <5 mIU/mL     I have reviewed the patient chart and performed the physical exam . I have ordered & interpreted the lab results and reviewed and interpreted them with the patient and reviewed the ultrasound images indepently Medications ordered as stated below.  A/P as described below.  Counseling and education provided on Miscarriage     Reassured patient that miscarriage is common with ~1/4 of women  experiencing it in their lifetime. Reassured patient that there is nothing she did or did not do to cause this. Reviewed most common reason is presumed to be genetic abnormalities that allow a pregnancy to start but not continue past an early stage, but realistically we do not know the cause in most cases. Reviewed that studies show no definite difference between attempting another pregnancy sooner vs waiting, though some studies do show better live birth outcomes with trying sooner. Reviewed options of expectant, medical, or surgical management. After counseling she elected for expectant management. Reviewed that cramping, bleeding are normal. Reviewed warning signs of heavy vaginal bleeding soaking through >1 pad per hour, crescendo abdominal pain, and fever. . Blood type --/--/B POS (12/25 2205), rhogam was not indicated.  We discussed return precautions including crescendo abdominal pain, heavy vaginal bleeding soaking >1 pad/hour, and fever.    ASSESSMENT Medical screening exam complete Miscarriage  Vaginal bleeding  Abdominal cramping affecting pregnancy  [redacted] weeks gestation of pregnancy     PLAN  Discharge from MAU in stable condition  F/U in 1 week for repeat HCG level- Trend to Zero. Patient reports she is an [facility] patient and will f/u with her OB[facility]GYN at [facility]  See AVS for full description of educational information and instructions provided to the patient at time of discharge   Warning signs for worsening condition that would warrant emergency follow-up discussed  Patient may return to MAU as needed   Allergies as of 06/10/2024   No Known Allergies      Medication List     STOP taking these medications    PrePLUS 27-1 MG Tabs       TAKE these medications    acetaminophen  500 MG tablet Commonly known as: TYLENOL  Take 1,000 mg by mouth every 6 (six) hours as needed.   ketorolac  10 MG tablet Commonly known as: TORADOL  Take 1 tablet (10 mg total) by  mouth every 6 (six) hours as needed for moderate pain (pain score 4-6) or severe pain (pain score 7-10).         Littie Olam LABOR, NP 06/10/2024 1:55 PM   "

## 2024-06-10 NOTE — MAU Note (Signed)
 Susan Austin is a 38 y.o. at [redacted]w[redacted]d here in MAU reporting: she thinks she's having a miscarriage.  Reports she's been bleeding for six days and cramping.  States having some dizziness.  Reports changing sanitary napkin every 2-3 hours. States Tylenol  isn't working.  States last took Tylenol  1000 mg @ 2100.  LMP: 04/28/2024 Onset of complaint: ongoing Pain score: 10 Vitals:   06/10/24 1002  BP: 135/82  Pulse: 70  Resp: 20  Temp: 98.3 F (36.8 C)  SpO2: 100%     FHT: NA  Lab orders placed from triage: None
